# Patient Record
Sex: Female | Born: 1975 | Race: White | Hispanic: No | Marital: Married | Smoking: Never smoker
Health system: Southern US, Community
[De-identification: ages and names within clinical notes are randomized; demographics above are authoritative.]

## PROBLEM LIST (undated history)

## (undated) DIAGNOSIS — A159 Respiratory tuberculosis unspecified: Secondary | ICD-10-CM

## (undated) HISTORY — DX: Respiratory tuberculosis unspecified: A15.9

---

## 2016-08-27 DIAGNOSIS — Z3A01 Less than 8 weeks gestation of pregnancy: Secondary | ICD-10-CM | POA: Diagnosis not present

## 2016-08-27 DIAGNOSIS — O09291 Supervision of pregnancy with other poor reproductive or obstetric history, first trimester: Secondary | ICD-10-CM | POA: Diagnosis not present

## 2016-08-27 DIAGNOSIS — Z3201 Encounter for pregnancy test, result positive: Secondary | ICD-10-CM | POA: Diagnosis not present

## 2016-08-30 ENCOUNTER — Other Ambulatory Visit (HOSPITAL_COMMUNITY): Payer: Self-pay | Admitting: Obstetrics

## 2016-08-30 DIAGNOSIS — O3680X0 Pregnancy with inconclusive fetal viability, not applicable or unspecified: Secondary | ICD-10-CM

## 2016-08-30 DIAGNOSIS — Z8759 Personal history of other complications of pregnancy, childbirth and the puerperium: Secondary | ICD-10-CM

## 2016-09-11 ENCOUNTER — Other Ambulatory Visit (HOSPITAL_COMMUNITY): Payer: Self-pay

## 2016-09-11 ENCOUNTER — Encounter (HOSPITAL_COMMUNITY): Payer: Self-pay

## 2016-09-17 DIAGNOSIS — O09521 Supervision of elderly multigravida, first trimester: Secondary | ICD-10-CM | POA: Diagnosis not present

## 2016-09-17 DIAGNOSIS — Z1151 Encounter for screening for human papillomavirus (HPV): Secondary | ICD-10-CM | POA: Diagnosis not present

## 2016-09-17 DIAGNOSIS — Z124 Encounter for screening for malignant neoplasm of cervix: Secondary | ICD-10-CM | POA: Diagnosis not present

## 2016-09-17 DIAGNOSIS — Z3A10 10 weeks gestation of pregnancy: Secondary | ICD-10-CM | POA: Diagnosis not present

## 2016-09-17 DIAGNOSIS — Z113 Encounter for screening for infections with a predominantly sexual mode of transmission: Secondary | ICD-10-CM | POA: Diagnosis not present

## 2016-09-17 LAB — OB RESULTS CONSOLE HEPATITIS B SURFACE ANTIGEN: Hepatitis B Surface Ag: NEGATIVE

## 2016-09-17 LAB — OB RESULTS CONSOLE HIV ANTIBODY (ROUTINE TESTING): HIV: NONREACTIVE

## 2016-09-17 LAB — OB RESULTS CONSOLE GC/CHLAMYDIA
Chlamydia: NEGATIVE
Gonorrhea: NEGATIVE

## 2016-09-17 LAB — OB RESULTS CONSOLE ABO/RH: RH Type: POSITIVE

## 2016-09-17 LAB — HM PAP SMEAR: HM Pap smear: NEGATIVE

## 2016-09-17 LAB — HIV ANTIBODY (ROUTINE TESTING W REFLEX): HIV Screen 4th Generation wRfx: NEGATIVE

## 2016-09-17 LAB — OB RESULTS CONSOLE RUBELLA ANTIBODY, IGM: Rubella: IMMUNE

## 2016-09-17 LAB — OB RESULTS CONSOLE RPR: RPR: NONREACTIVE

## 2016-09-17 LAB — OB RESULTS CONSOLE ANTIBODY SCREEN: Antibody Screen: NEGATIVE

## 2016-10-15 DIAGNOSIS — O09522 Supervision of elderly multigravida, second trimester: Secondary | ICD-10-CM | POA: Diagnosis not present

## 2016-10-15 DIAGNOSIS — Z3A14 14 weeks gestation of pregnancy: Secondary | ICD-10-CM | POA: Diagnosis not present

## 2016-10-29 DIAGNOSIS — Z361 Encounter for antenatal screening for raised alphafetoprotein level: Secondary | ICD-10-CM | POA: Diagnosis not present

## 2016-11-23 DIAGNOSIS — Z3A19 19 weeks gestation of pregnancy: Secondary | ICD-10-CM | POA: Diagnosis not present

## 2016-11-23 DIAGNOSIS — O09522 Supervision of elderly multigravida, second trimester: Secondary | ICD-10-CM | POA: Diagnosis not present

## 2016-12-19 DIAGNOSIS — O09522 Supervision of elderly multigravida, second trimester: Secondary | ICD-10-CM | POA: Diagnosis not present

## 2016-12-19 DIAGNOSIS — Z3A23 23 weeks gestation of pregnancy: Secondary | ICD-10-CM | POA: Diagnosis not present

## 2017-01-18 DIAGNOSIS — O09522 Supervision of elderly multigravida, second trimester: Secondary | ICD-10-CM | POA: Diagnosis not present

## 2017-01-18 DIAGNOSIS — Z3A27 27 weeks gestation of pregnancy: Secondary | ICD-10-CM | POA: Diagnosis not present

## 2017-01-18 DIAGNOSIS — Z3689 Encounter for other specified antenatal screening: Secondary | ICD-10-CM | POA: Diagnosis not present

## 2017-01-30 DIAGNOSIS — Z23 Encounter for immunization: Secondary | ICD-10-CM | POA: Diagnosis not present

## 2017-01-30 DIAGNOSIS — Z3689 Encounter for other specified antenatal screening: Secondary | ICD-10-CM | POA: Diagnosis not present

## 2017-01-30 DIAGNOSIS — O09523 Supervision of elderly multigravida, third trimester: Secondary | ICD-10-CM | POA: Diagnosis not present

## 2017-01-30 DIAGNOSIS — Z3A29 29 weeks gestation of pregnancy: Secondary | ICD-10-CM | POA: Diagnosis not present

## 2017-02-13 DIAGNOSIS — Z3A31 31 weeks gestation of pregnancy: Secondary | ICD-10-CM | POA: Diagnosis not present

## 2017-02-13 DIAGNOSIS — O09523 Supervision of elderly multigravida, third trimester: Secondary | ICD-10-CM | POA: Diagnosis not present

## 2017-02-27 DIAGNOSIS — O09523 Supervision of elderly multigravida, third trimester: Secondary | ICD-10-CM | POA: Diagnosis not present

## 2017-02-27 DIAGNOSIS — Z23 Encounter for immunization: Secondary | ICD-10-CM | POA: Diagnosis not present

## 2017-02-27 DIAGNOSIS — Z3A33 33 weeks gestation of pregnancy: Secondary | ICD-10-CM | POA: Diagnosis not present

## 2017-03-06 ENCOUNTER — Inpatient Hospital Stay (HOSPITAL_COMMUNITY): Payer: BLUE CROSS/BLUE SHIELD

## 2017-03-06 ENCOUNTER — Encounter (HOSPITAL_COMMUNITY): Payer: Self-pay | Admitting: *Deleted

## 2017-03-06 ENCOUNTER — Inpatient Hospital Stay (HOSPITAL_COMMUNITY)
Admit: 2017-03-06 | Discharge: 2017-03-06 | Disposition: A | Discharge status: Home / Self Care | Payer: BLUE CROSS/BLUE SHIELD | Attending: Obstetrics | Admitting: Obstetrics

## 2017-03-06 ENCOUNTER — Inpatient Hospital Stay (HOSPITAL_COMMUNITY)
Admission: AD | Admit: 2017-03-06 | Discharge: 2017-03-09 | DRG: 783 | Disposition: A | Discharge status: Home / Self Care | Payer: BLUE CROSS/BLUE SHIELD | Source: Ambulatory Visit | Attending: Obstetrics and Gynecology | Admitting: Obstetrics and Gynecology

## 2017-03-06 DIAGNOSIS — Z9851 Tubal ligation status: Secondary | ICD-10-CM

## 2017-03-06 DIAGNOSIS — Z3A34 34 weeks gestation of pregnancy: Secondary | ICD-10-CM | POA: Diagnosis not present

## 2017-03-06 DIAGNOSIS — O09293 Supervision of pregnancy with other poor reproductive or obstetric history, third trimester: Secondary | ICD-10-CM | POA: Diagnosis not present

## 2017-03-06 DIAGNOSIS — O365930 Maternal care for other known or suspected poor fetal growth, third trimester, not applicable or unspecified: Principal | ICD-10-CM | POA: Diagnosis present

## 2017-03-06 DIAGNOSIS — Z302 Encounter for sterilization: Secondary | ICD-10-CM

## 2017-03-06 DIAGNOSIS — O365990 Maternal care for other known or suspected poor fetal growth, unspecified trimester, not applicable or unspecified: Secondary | ICD-10-CM | POA: Diagnosis present

## 2017-03-06 DIAGNOSIS — O368993 Maternal care for other specified fetal problems, unspecified trimester, fetus 3: Secondary | ICD-10-CM | POA: Diagnosis not present

## 2017-03-06 DIAGNOSIS — Z0374 Encounter for suspected problem with fetal growth ruled out: Secondary | ICD-10-CM | POA: Diagnosis not present

## 2017-03-06 DIAGNOSIS — O365993 Maternal care for other known or suspected poor fetal growth, unspecified trimester, fetus 3: Secondary | ICD-10-CM | POA: Diagnosis not present

## 2017-03-06 DIAGNOSIS — Z3689 Encounter for other specified antenatal screening: Secondary | ICD-10-CM | POA: Diagnosis not present

## 2017-03-06 DIAGNOSIS — O09523 Supervision of elderly multigravida, third trimester: Secondary | ICD-10-CM | POA: Diagnosis not present

## 2017-03-06 LAB — COMPREHENSIVE METABOLIC PANEL
ALT: 13 U/L — ABNORMAL LOW (ref 14–54)
AST: 23 U/L (ref 15–41)
Albumin: 3.2 g/dL — ABNORMAL LOW (ref 3.5–5.0)
Alkaline Phosphatase: 139 U/L — ABNORMAL HIGH (ref 38–126)
Anion gap: 9 (ref 5–15)
BUN: 10 mg/dL (ref 6–20)
CO2: 19 mmol/L — ABNORMAL LOW (ref 22–32)
Calcium: 8.7 mg/dL — ABNORMAL LOW (ref 8.9–10.3)
Chloride: 106 mmol/L (ref 101–111)
Creatinine, Ser: 0.67 mg/dL (ref 0.44–1.00)
GFR calc Af Amer: >60 mL/min (ref >60)
GFR calc non Af Amer: >60 mL/min (ref >60)
Glucose, Bld: 74 mg/dL (ref 65–99)
Potassium: 4.1 mmol/L (ref 3.5–5.1)
Sodium: 134 mmol/L — ABNORMAL LOW (ref 135–145)
Total Bilirubin: 0.4 mg/dL (ref 0.3–1.2)
Total Protein: 6.3 g/dL — ABNORMAL LOW (ref 6.5–8.1)

## 2017-03-06 LAB — CBC
HCT: 41.5 % (ref 36.0–46.0)
Hemoglobin: 14.5 g/dL (ref 12.0–15.0)
MCH: 31.5 pg (ref 26.0–34.0)
MCHC: 34.9 g/dL (ref 30.0–36.0)
MCV: 90.2 fL (ref 78.0–100.0)
Platelets: 205 10*3/uL (ref 150–400)
RBC: 4.6 MIL/uL (ref 3.87–5.11)
RDW: 13.3 % (ref 11.5–15.5)
WBC: 11 10*3/uL — ABNORMAL HIGH (ref 4.0–10.5)

## 2017-03-06 LAB — ABO/RH: ABO/RH(D): O POS

## 2017-03-06 LAB — TYPE AND SCREEN
ABO/RH(D): O POS
Antibody Screen: NEGATIVE

## 2017-03-06 LAB — URIC ACID: Uric Acid, Serum: 4.9 mg/dL (ref 2.3–6.6)

## 2017-03-06 MED ORDER — SOD CITRATE-CITRIC ACID 500-334 MG/5ML PO SOLN
ORAL | Status: AC
  Administered 2017-03-07: 30 mL via ORAL
  Filled 2017-03-06: qty 15

## 2017-03-06 MED ORDER — BETAMETHASONE SOD PHOS & ACET 6 (3-3) MG/ML IJ SUSP
12.0000 mg | Freq: Once | INTRAMUSCULAR | Status: AC
  Administered 2017-03-07: 12 mg via INTRAMUSCULAR
  Filled 2017-03-06: qty 2

## 2017-03-06 MED ORDER — PRENATAL MULTIVITAMIN CH: 1.0000 | ORAL_TABLET | Freq: Every day | ORAL | Status: DC

## 2017-03-06 MED ORDER — CALCIUM CARBONATE ANTACID 500 MG PO CHEW: 2.0000 | CHEWABLE_TABLET | ORAL | Status: DC | PRN

## 2017-03-06 MED ORDER — LACTATED RINGERS IV SOLN
INTRAVENOUS | Status: DC
  Administered 2017-03-06: 17:00:00 via INTRAVENOUS
  Administered 2017-03-06: 125 mL via INTRAVENOUS
  Administered 2017-03-06: 300 mL via INTRAVENOUS
  Administered 2017-03-06 – 2017-03-07 (×5): via INTRAVENOUS

## 2017-03-06 MED ORDER — ACETAMINOPHEN 325 MG PO TABS
650.0000 mg | ORAL_TABLET | ORAL | Status: DC | PRN
  Administered 2017-03-06: 650 mg via ORAL
  Filled 2017-03-06: qty 2

## 2017-03-06 MED ORDER — ZOLPIDEM TARTRATE 5 MG PO TABS: 5.0000 mg | ORAL_TABLET | Freq: Every evening | ORAL | Status: DC | PRN

## 2017-03-06 MED ORDER — DOCUSATE SODIUM 100 MG PO CAPS
100.0000 mg | ORAL_CAPSULE | Freq: Every day | ORAL | Status: DC
  Administered 2017-03-08 – 2017-03-09 (×2): 100 mg via ORAL
  Filled 2017-03-06 (×2): qty 1

## 2017-03-06 NOTE — Progress Notes (Signed)
BPP 6/8 this pm on repeat UAD with intermittent AEDF AFI nl per MFM. BP 131/69   Pulse (!) 102   Temp 98.2 F (36.8 C) (Oral)   Resp 16   Ht  (1.6 m)   Wt 77.1 kg (170 lb)   Breastfeeding? Unknown   BMI 30.11 kg/m   CBC    Component Value Date/Time   WBC 11.0 (H) 03/06/2017 1045   RBC 4.60 03/06/2017 1045   HGB 14.5 03/06/2017 1045   HCT 41.5 03/06/2017 1045   PLT 205 03/06/2017 1045   MCV 90.2 03/06/2017 1045   MCH 31.5 03/06/2017 1045   MCHC 34.9 03/06/2017 1045   RDW 13.3 03/06/2017 1045    CMP     Component Value Date/Time   NA 134 (L) 03/06/2017 1045   K 4.1 03/06/2017 1045   CL 106 03/06/2017 1045   CO2 19 (L) 03/06/2017 1045   GLUCOSE 74 03/06/2017 1045   BUN 10 03/06/2017 1045   CREATININE 0.67 03/06/2017 1045   CALCIUM 8.7 (L) 03/06/2017 1045   PROT 6.3 (L) 03/06/2017 1045   ALBUMIN 3.2 (L) 03/06/2017 1045   AST 23 03/06/2017 1045   ALT 13 (L) 03/06/2017 1045   ALKPHOS 139 (H) 03/06/2017 1045   BILITOT 0.4 03/06/2017 1045   GFRNONAA >60 03/06/2017 1045   GFRAA >60 03/06/2017 1045    FHR 140-150 , occ deep variable , occ prolonged decel, Baseline the BTBV 5-25, 15x15 accels noted Pt strongly desires VD and decline csection at this time. Will observe tracing (category 2) at this time.  Will consider MFM  And NICU recommendations to complete BMZ course if fetus tolerant. Will consider rpt BPP pending FHR tracing in am. If IOL considered, Will do CST prior.

## 2017-03-06 NOTE — H&P (Addendum)
Mary Ingram is a 41 y.o. female she is G10, P6. Sent from office by Dr Ernestina Penna for admission for IUGR, abnormal Dopplers (AEDF) and BPP 4/8.  Pt had growth sono 3 wks back and there is no interval growth today.   Healthy but AMA, declined Aneuploidy screen. AFP1 normal. Anatomy sono normal.  She reports normal fetal movements. No contractions/ bleeding/ vaginal fluid.  Prior 6 SVDs, last child 1 yo, didn't take epidural for most of her deliveries.   Office sono- EFW 3'1" with 0% interval growth since last sono 3 wks back on 9/19 (EFW 2'14" then). AFI 6.7 cm. VTX. BPP 4/8. S/D elevated with absent EDF.    OB History    Gravida Para Term Preterm AB Living   SAB TAB Ectopic Multiple Live Births   3       6     Past Medical History:  Diagnosis Date  . Tuberculosis    Positive skin test in college, took medication   History reviewed. No pertinent surgical history. Family History: family history includes Alzheimer's disease in her paternal grandmother; Diabetes in her maternal grandfather, maternal grandmother, and mother; Heart disease in her maternal grandfather; Hyperlipidemia in her maternal grandfather, maternal grandmother, and mother; Hypertension in her maternal grandfather, maternal grandmother, and mother; Mental illness in her maternal grandfather and maternal grandmother. Social History:  reports that she has never smoked. She has never used smokeless tobacco. She reports that she does not drink alcohol or use drugs.     Maternal Diabetes: No Genetic Screening: Declined Maternal Ultrasounds/Referrals: Normal anatomy sono.  Fetal Ultrasounds or other Referrals:  MFM referral today for BPP/ Dopplers and delivery planning Maternal Substance Abuse:  No Significant Maternal Medications:  None Significant Maternal Lab Results: GBS sent now  Other Comments:  None  ROS  neg History   Exam Physical Exam  BP 139/70   Pulse 93   Temp 98.2 F (36.8 C) (Oral)    Resp 20   Ht  (1.6 m)   Wt 170 lb (77.1 kg)   Breastfeeding? Unknown   BMI 30.11 kg/m   A&O x 3, no acute distress. Pleasant but tearful, feels she is being blamed for not eating well.  HEENT neg, no thyromegaly Lungs CTA bilat CV RRR, S1S2 normal Abdo soft, non tender, non acute Extr no edema/ tenderness Pelvic deferred FHT  145/ + accels/ no decels/ minimum to mod variability/ overall reactive Toco None   Prenatal labs: ABO, Rh: O/Positive/-- (04/23 0000) Antibody: Negative (04/23 0000) Rubella: Immune RPR: Nonreactive (04/23 0000)  HBsAg: Negative (04/23 0000)  HIV: Non-reactive (04/23 0000)  GBS:   sent today Aneuploidy screen declined AFP1 normal   Assessment/Plan: 41 yo, AMA, G10,P6- here at 34.3 wks with severe IUGR, abnormal Dopplers with absent end diastolic flow and BPP 4/8. FHT is reactive (6/10). BTMZ #1 at 10 am today (in office) MFM consult, repeat BPP, Dopplers, delivery planning.  Vtx, plan IOL if baby tolerates labor. If fetal intolerance to labor and C/section needed, she agrees and desires Tubal Ligation for permanent sterilization.     Mary Ingram 03/06/2017, 11:45 AM

## 2017-03-06 NOTE — Consult Note (Signed)
MFM Note  Ms. Mary Ingram is a 41 year old G46P6A3 Caucasian female at 34+3 weeks who was admitted this AM for severe fetal growth restriction. Her prenatal care had been fairly unremarkable until ~ 3 weeks ago when an ultrasound showed the EFW to be at the 15th %tile, AC at the 1st %tile, normal AFV and normal UA dopplers. The follow-up ultrasound today showed essentially no interval growth, abnormal dopplers and a BPP of 4/8. She received her first BMZ injection in the office before admission.  OB History: 6 full term vaginal deliveries; last one at 38 weeks for FGR; SABs x 3  A repeat US in MFM showed an overall growth lag of ~ 5 weeks with the EFW < 10th %tile (2+15 or 1335 grams),  AC < 3rd %tile; low normal AFV, cephalic presentation, no gross abnormalities, BPP 6/8 (-2 for no BM) and UA dopplers with elevated S/D ratios with a few tracings showing absent end diastolic flow.  Ms. Mary Ingram reports excellent fetal movement and has no other complaints.  Assessment: 1) SIUP at 34+3 weeks 2) Severe fetal growth restriction, most likely secondary to placental insufficiency 3) AMA; declined aneuploidy testing 4) H/O FGR in last pregnancy  Based on gestational age, severity of growth restriction and abnormal UA dopplers (AEDF), I recommend delivery. Delivery may be delayed for a course of BMZ to be given if continuous monitoring remains reassuring. Method of delivery deferred to attending OB but trial of induction not unreasonable if tracing remains reactive.  Thank you for the kind referral.  (Face-to-face consultation with patient: 45 min)

## 2017-03-06 NOTE — Progress Notes (Signed)
Patient ID: Mary Ingram, female   DOB: 06/09/1975, 41 y.o.   MRN: [id number] Late entry-  At 12 noon FHT started to decelerate, went down to 60s, at 5-6 minutes, we counseled pt on emergency C-section, Anesthesia team in room and OR informed. Pt agreed. But at 8 minutes it was back to baseline 145- and with normal moderate variability and no further decels noted.  So C/section was placed on hold. Pt's husband contacted who was en route.  I spoke with Dr Sherrie George as repeating BPP/ Dopplers would not be indicated at this point and delivery should be planned. Dr Sherrie George agreed, considering 34 wks, no interval growth in 3 weeks, S/D with AEDF and now a decel, agreed to proceed with delivery.  Considering FHT was back to normal and prior 6 SVDs, option to attempt vaginal delivery with careful induction v/s C/section was reviewed with patient. She would like vaginal trial as long as baby allowed and will accept C-section if needed.  Her husband arrived at 12.40 pm and I reviewed all the findings from this morning sono, current FHT and 8 minute decel.  We reviewed tubal ligation as permanent method of sterilization and risk of regret and irreversibility, reviewed salpingectomy data.   After discussion, plan is to consider OCT. Will transfer care to now on call MD Dr Billy Coast, and will have plan once he arrives.   V.Deneene Tarver, MD

## 2017-03-06 NOTE — Consult Note (Signed)
Neonatology Consult to Antenatal Patient:  I was asked by Dr. Billy Coast to see this patient in order to provide antenatal counseling due to IUGR, absent EDF, and BPP 4/8.  Mary Ingram was admitted today at 76 3/[redacted] weeks GA after being seen in the office and noted to have no fetal growth in the past 3 weeks. She is currently not having active labor. She is getting BMZ. The baby is a female. Delivery is anticipated as soon as the steroid course is complete or sooner if there is fetal distress.  I spoke with the patient and her husband. We discussed what they could expect at delivery in the next 2-3 days, including usual DR management, possible respiratory complications and need for support (not likely to require much support, as SGA infants often have accelerated lung maturity), IV access, feedings (mother desires formula feeding, and is not interested in donor breast milk), LOS, Mortality and Morbidity, and long term outcomes. They did not have any questions at this time. I offered a NICU tour to any interested family members and would be glad to come back if they have more questions later.  Thank you for asking me to see this patient.  Mary Sou, MD Neonatologist  The total length of face-to-face or floor/unit time for this encounter was 20 minutes. Counseling and/or coordination of care was 15 minutes of the above.

## 2017-03-06 NOTE — Progress Notes (Signed)
Patient ID: Mary Ingram, female   DOB: 1976/02/12, 41 y.o.   MRN: [id number] Previous notes reviewed. Reports good FM BP (!) 132/52   Pulse 99   Temp 98.2 F (36.8 C) (Oral)   Resp 18   Ht  (1.6 m)   Wt 77.1 kg (170 lb)   Breastfeeding? Unknown   BMI 30.11 kg/m   FHR 140-150, BTBV 5-25, random late decels noted occ mild variable noted. Overall reassuring FHR tracing. Case discussed with Dr. Decker(MFM) and Dr DaVonzo(NICU).  Benefits of BMZ in this setting (EGA, IUGR, abnormal UAD) discussed. Will repeat BPP and UAD at this time. If reassuring and FHR continues to be reassuring , will complete BMZ course and then proceed with CST. If BPP not reassuring, will do CST. If persistent decels or category 3 tracing, will proceed with csection. Pt and husband present for discussion. They concur with plan and will proceed.

## 2017-03-06 NOTE — Progress Notes (Signed)
Patient ID: Mary Ingram, female   DOB: 10-Jun-1975, 41 y.o.   MRN: [id number]  Pt comfortable Good FM BP (!) 118/56   Pulse (!) 106   Temp 98 F (36.7 C) (Oral)   Resp 14   Ht  (1.6 m)   Wt 77.1 kg (170 lb)   Breastfeeding? Unknown   BMI 30.11 kg/m  FHR tracing stable with intermittent 1-2 min decels and long stretches of reassuring FHR. Baseline 140-150, BTBV 5-25, 15x15 accels. Category 2 tracing in setting of prematurity. BMZ incomplete Will continue to monitor closely. Plan discussed with MFM

## 2017-03-07 ENCOUNTER — Encounter (HOSPITAL_COMMUNITY): Payer: Self-pay | Admitting: Certified Registered Nurse Anesthetist

## 2017-03-07 ENCOUNTER — Inpatient Hospital Stay (HOSPITAL_COMMUNITY): Payer: BLUE CROSS/BLUE SHIELD | Admitting: Anesthesiology

## 2017-03-07 ENCOUNTER — Encounter (HOSPITAL_COMMUNITY)
Admission: AD | Disposition: A | Discharge status: Home / Self Care | Payer: Self-pay | Source: Ambulatory Visit | Attending: Obstetrics and Gynecology

## 2017-03-07 DIAGNOSIS — Z9851 Tubal ligation status: Secondary | ICD-10-CM

## 2017-03-07 LAB — RPR: RPR Ser Ql: NONREACTIVE

## 2017-03-07 SURGERY — Surgical Case
Anesthesia: Spinal

## 2017-03-07 MED ORDER — ONDANSETRON HCL 4 MG/2ML IJ SOLN
INTRAMUSCULAR | Status: DC | PRN
  Administered 2017-03-07: 4 mg via INTRAVENOUS

## 2017-03-07 MED ORDER — BUPIVACAINE IN DEXTROSE 0.75-8.25 % IT SOLN
INTRATHECAL | Status: DC | PRN
  Administered 2017-03-07: 1.3 mL via INTRATHECAL

## 2017-03-07 MED ORDER — TERBUTALINE SULFATE 1 MG/ML IJ SOLN: 0.2500 mg | Freq: Once | INTRAMUSCULAR | Status: DC | PRN

## 2017-03-07 MED ORDER — IBUPROFEN 600 MG PO TABS: 600.0000 mg | ORAL_TABLET | Freq: Four times a day (QID) | ORAL | Status: DC

## 2017-03-07 MED ORDER — OXYTOCIN 10 UNIT/ML IJ SOLN
INTRAMUSCULAR | Status: AC
  Filled 2017-03-07: qty 4

## 2017-03-07 MED ORDER — OXYTOCIN 40 UNITS IN LACTATED RINGERS INFUSION - SIMPLE MED: 2.5000 [IU]/h | INTRAVENOUS | Status: AC

## 2017-03-07 MED ORDER — PRENATAL MULTIVITAMIN CH
1.0000 | ORAL_TABLET | Freq: Every day | ORAL | Status: DC
  Administered 2017-03-08: 1 via ORAL
  Filled 2017-03-07: qty 1

## 2017-03-07 MED ORDER — DIPHENHYDRAMINE HCL 25 MG PO CAPS: 25.0000 mg | ORAL_CAPSULE | Freq: Four times a day (QID) | ORAL | Status: DC | PRN

## 2017-03-07 MED ORDER — IBUPROFEN 600 MG PO TABS
600.0000 mg | ORAL_TABLET | Freq: Four times a day (QID) | ORAL | Status: DC
  Administered 2017-03-08 – 2017-03-09 (×4): 600 mg via ORAL
  Filled 2017-03-07 (×4): qty 1

## 2017-03-07 MED ORDER — MEPERIDINE HCL 25 MG/ML IJ SOLN
INTRAMUSCULAR | Status: DC | PRN
  Administered 2017-03-07 (×2): 12.5 mg via INTRAVENOUS

## 2017-03-07 MED ORDER — OXYTOCIN 40 UNITS IN LACTATED RINGERS INFUSION - SIMPLE MED
INTRAVENOUS | Status: AC
  Filled 2017-03-07: qty 1000

## 2017-03-07 MED ORDER — FENTANYL CITRATE (PF) 100 MCG/2ML IJ SOLN
INTRAMUSCULAR | Status: DC | PRN
  Administered 2017-03-07: 25 ug via INTRATHECAL

## 2017-03-07 MED ORDER — SENNOSIDES-DOCUSATE SODIUM 8.6-50 MG PO TABS
2.0000 | ORAL_TABLET | ORAL | Status: DC
  Administered 2017-03-07 – 2017-03-08 (×2): 2 via ORAL
  Filled 2017-03-07 (×2): qty 2

## 2017-03-07 MED ORDER — WITCH HAZEL-GLYCERIN EX PADS: 1.0000 "application " | MEDICATED_PAD | CUTANEOUS | Status: DC | PRN

## 2017-03-07 MED ORDER — SIMETHICONE 80 MG PO CHEW: 80.0000 mg | CHEWABLE_TABLET | ORAL | Status: DC | PRN

## 2017-03-07 MED ORDER — KETOROLAC TROMETHAMINE 30 MG/ML IJ SOLN: 30.0000 mg | Freq: Four times a day (QID) | INTRAMUSCULAR | Status: AC | PRN

## 2017-03-07 MED ORDER — LACTATED RINGERS IV SOLN
INTRAVENOUS | Status: DC | PRN
  Administered 2017-03-07: 14:00:00 via INTRAVENOUS

## 2017-03-07 MED ORDER — COCONUT OIL OIL: 1.0000 "application " | TOPICAL_OIL | Status: DC | PRN

## 2017-03-07 MED ORDER — MORPHINE SULFATE (PF) 0.5 MG/ML IJ SOLN
INTRAMUSCULAR | Status: DC | PRN
Start: 2017-03-07 — End: 2017-03-07
  Administered 2017-03-07: .1 mg via INTRATHECAL

## 2017-03-07 MED ORDER — ACETAMINOPHEN 325 MG PO TABS: 650.0000 mg | ORAL_TABLET | ORAL | Status: DC | PRN

## 2017-03-07 MED ORDER — MENTHOL 3 MG MT LOZG: 1.0000 | LOZENGE | OROMUCOSAL | Status: DC | PRN

## 2017-03-07 MED ORDER — MORPHINE SULFATE (PF) 0.5 MG/ML IJ SOLN
INTRAMUSCULAR | Status: AC
  Filled 2017-03-07: qty 10

## 2017-03-07 MED ORDER — ONDANSETRON HCL 4 MG/2ML IJ SOLN
INTRAMUSCULAR | Status: AC
  Filled 2017-03-07: qty 2

## 2017-03-07 MED ORDER — OXYTOCIN 40 UNITS IN LACTATED RINGERS INFUSION - SIMPLE MED
1.0000 m[IU]/min | INTRAVENOUS | Status: DC
  Administered 2017-03-07: 1 m[IU]/min via INTRAVENOUS
  Administered 2017-03-07: 3 m[IU]/min via INTRAVENOUS
  Administered 2017-03-07: 2 m[IU]/min via INTRAVENOUS

## 2017-03-07 MED ORDER — OXYCODONE-ACETAMINOPHEN 5-325 MG PO TABS: 1.0000 | ORAL_TABLET | ORAL | Status: DC | PRN

## 2017-03-07 MED ORDER — BUPIVACAINE IN DEXTROSE 0.75-8.25 % IT SOLN
INTRATHECAL | Status: AC
  Filled 2017-03-07: qty 2

## 2017-03-07 MED ORDER — SIMETHICONE 80 MG PO CHEW
80.0000 mg | CHEWABLE_TABLET | Freq: Three times a day (TID) | ORAL | Status: DC
  Administered 2017-03-08 – 2017-03-09 (×3): 80 mg via ORAL
  Filled 2017-03-07 (×3): qty 1

## 2017-03-07 MED ORDER — LACTATED RINGERS IV SOLN
INTRAVENOUS | Status: DC | PRN
  Administered 2017-03-07: 40 [IU] via INTRAVENOUS

## 2017-03-07 MED ORDER — OXYCODONE-ACETAMINOPHEN 5-325 MG PO TABS: 2.0000 | ORAL_TABLET | ORAL | Status: DC | PRN

## 2017-03-07 MED ORDER — DIBUCAINE 1 % RE OINT: 1.0000 "application " | TOPICAL_OINTMENT | RECTAL | Status: DC | PRN

## 2017-03-07 MED ORDER — PHENYLEPHRINE 8 MG IN D5W 100 ML (0.08MG/ML) PREMIX OPTIME
INJECTION | INTRAVENOUS | Status: DC | PRN
  Administered 2017-03-07: 60 ug/min via INTRAVENOUS

## 2017-03-07 MED ORDER — CEFAZOLIN SODIUM-DEXTROSE 2-4 GM/100ML-% IV SOLN
INTRAVENOUS | Status: AC
  Filled 2017-03-07: qty 100

## 2017-03-07 MED ORDER — MEPERIDINE HCL 25 MG/ML IJ SOLN
INTRAMUSCULAR | Status: AC
  Filled 2017-03-07: qty 1

## 2017-03-07 MED ORDER — SOD CITRATE-CITRIC ACID 500-334 MG/5ML PO SOLN
30.0000 mL | Freq: Once | ORAL | Status: AC
  Administered 2017-03-07: 30 mL via ORAL

## 2017-03-07 MED ORDER — FENTANYL CITRATE (PF) 100 MCG/2ML IJ SOLN
INTRAMUSCULAR | Status: AC
  Filled 2017-03-07: qty 2

## 2017-03-07 MED ORDER — SIMETHICONE 80 MG PO CHEW
80.0000 mg | CHEWABLE_TABLET | ORAL | Status: DC
  Administered 2017-03-07 – 2017-03-08 (×2): 80 mg via ORAL
  Filled 2017-03-07 (×2): qty 1

## 2017-03-07 MED ORDER — CEFAZOLIN SODIUM-DEXTROSE 2-3 GM-% IV SOLR
INTRAVENOUS | Status: DC | PRN
  Administered 2017-03-07: 2 g via INTRAVENOUS

## 2017-03-07 MED ORDER — LACTATED RINGERS IV SOLN: INTRAVENOUS | Status: DC

## 2017-03-07 SURGICAL SUPPLY — 34 items
BENZOIN TINCTURE PRP APPL 2/3 (GAUZE/BANDAGES/DRESSINGS) ×2 IMPLANT
CHLORAPREP W/TINT 26ML (MISCELLANEOUS) ×2 IMPLANT
CLAMP CORD UMBIL (MISCELLANEOUS) IMPLANT
CLOSURE STERI STRIP 1/2 X4 (GAUZE/BANDAGES/DRESSINGS) ×2 IMPLANT
CLOTH BEACON ORANGE TIMEOUT ST (SAFETY) ×2 IMPLANT
CONTAINER PREFILL 10% NBF 15ML (MISCELLANEOUS) IMPLANT
DRSG OPSITE POSTOP 4X10 (GAUZE/BANDAGES/DRESSINGS) ×2 IMPLANT
ELECT REM PT RETURN 9FT ADLT (ELECTROSURGICAL) ×2
ELECTRODE REM PT RTRN 9FT ADLT (ELECTROSURGICAL) ×1 IMPLANT
EXTRACTOR VACUUM M CUP 4 TUBE (SUCTIONS) IMPLANT
GLOVE BIO SURGEON STRL SZ 6.5 (GLOVE) ×2 IMPLANT
GLOVE BIOGEL PI IND STRL 7.0 (GLOVE) ×2 IMPLANT
GLOVE BIOGEL PI INDICATOR 7.0 (GLOVE) ×2
GOWN STRL REUS W/TWL LRG LVL3 (GOWN DISPOSABLE) ×4 IMPLANT
HEMOSTAT SURGICEL 2X14 (HEMOSTASIS) ×2 IMPLANT
KIT ABG SYR 3ML LUER SLIP (SYRINGE) IMPLANT
NEEDLE HYPO 22GX1.5 SAFETY (NEEDLE) IMPLANT
NEEDLE HYPO 25X5/8 SAFETYGLIDE (NEEDLE) IMPLANT
NS IRRIG 1000ML POUR BTL (IV SOLUTION) ×2 IMPLANT
PACK C SECTION WH (CUSTOM PROCEDURE TRAY) ×2 IMPLANT
PAD OB MATERNITY 4.3X12.25 (PERSONAL CARE ITEMS) ×2 IMPLANT
PENCIL SMOKE EVAC W/HOLSTER (ELECTROSURGICAL) ×2 IMPLANT
STRIP CLOSURE SKIN 1/2X4 (GAUZE/BANDAGES/DRESSINGS) IMPLANT
SUT MON AB 4-0 PS1 27 (SUTURE) ×2 IMPLANT
SUT PLAIN 0 NONE (SUTURE) ×2 IMPLANT
SUT PLAIN 2 0 XLH (SUTURE) IMPLANT
SUT VIC AB 0 CT1 36 (SUTURE) ×4 IMPLANT
SUT VIC AB 0 CTX 36 (SUTURE) ×2
SUT VIC AB 0 CTX36XBRD ANBCTRL (SUTURE) ×2 IMPLANT
SUT VIC AB 2-0 CT1 27 (SUTURE) ×1
SUT VIC AB 2-0 CT1 TAPERPNT 27 (SUTURE) ×1 IMPLANT
SYR CONTROL 10ML LL (SYRINGE) IMPLANT
TOWEL OR 17X24 6PK STRL BLUE (TOWEL DISPOSABLE) ×2 IMPLANT
TRAY FOLEY BAG SILVER LF 14FR (SET/KITS/TRAYS/PACK) IMPLANT

## 2017-03-07 NOTE — Anesthesia Procedure Notes (Signed)
Spinal  Patient location during procedure: OR Staffing Anesthesiologist: Cristela Blue Spinal Block Patient position: sitting Prep: DuraPrep Patient monitoring: heart rate, blood pressure and continuous pulse ox Approach: right paramedian Location: L3-4 Injection technique: single-shot Needle Needle type: Sprotte  Needle gauge: 24 G Needle length: 9 cm Assessment Sensory level: T4 Additional Notes Spinal Dosage in OR  .75% Bupivicaine ml       1.3     [id number]   mcg        100    Fentanyl mcg            25

## 2017-03-07 NOTE — Op Note (Signed)
Cesarean Section Procedure Note   Mary Ingram  03/06/2017 - 03/07/2017  Indications: IUP at 34.4 wga, IUGR with abnormal dopplars, failed CST; unfavorable cervix and remote from delivery; desires permanent sterilization  Pre-operative Diagnosis: primary cesarean section and bilateral tubal ligation.   Post-operative Diagnosis: Same   Surgeon: Surgeon(s) and Role:    Noland Fordyce, MD -fist assist    * Jakyra Kenealy, Candace Gallus, MD   Assistants: Carlean Jews, second assist  Anesthesia: spinal   Procedure Details:  The patient was seen in the labor room and recommendation to proceed with delivery by c-section and pt agreed.  The risks, benefits, complications, treatment options, and expected outcomes were discussed with the patient. The patient concurred with the proposed plan, giving informed consent. identified as Mary Ingram and the procedure verified as C-Section Delivery with bilateral tubal ligation. A Time Out was held and the above information confirmed.  After induction of anesthesia, the patient was draped and prepped in the usual sterile manner. A transverse skin incision was made and carried down through the subcutaneous tissue to the fascia. Fascial incision was made with the scalpel and extended transversely. The fascia was separated from the underlying rectus tissue superiorly and inferiorly. The peritoneum was identified and entered. Peritoneal incision was extended longitudinally. The utero-vesical peritoneal reflection was incised transversely and the bladder flap was bluntly freed from the lower uterine segment. The lower uterine segment was evaluated and appeared properly developed for LUS incision.  A low transverse uterine incision was made. The infant was then delivered from cephalic presentation and body quick followed after delivery of head.  Nuchal cord x1 was noted and easily reduced. The umbilical cord was then  clamped and cut cord blood was obtained for  evaluation. The placenta was delivered spontaneously and intact. Very narrow umbilical cord noted.  The uterus was then exteriorized and cleared of clot and debirs.  The uterine outline, tubes and ovaries appeared normal}. The uterine incision was closed with running locked sutures of .  An embrocating layer was then performed using an 0 monocryl.    Figure of eight stitches placed at midline and excellent Hemostasis was observed. Clots cleared from pelvis with lap sponges.  Attention was then drawn to the right fallopian tube and was then grasped in the midportion of the tube with a babcock.  The bovie was then used to make a small opening in the mesosalpinx below the babcock.  The tube was then doubly clamped with plain gut.  The tube was blanched at both sited then the tube was transected with the metzembaum scissors.  Attention was then drawn to the left tube which was removed in a similar fashion.  The bovie was used to ensure excellent hemostasis from the pedicles and the sutures were then cut.  There was minimal bleeding then noted inferior to the uterine incision.  The bovie was carefully used to achieve hemostasis on the uterus about 2cm below the uterine incision.  The uterus was then placed back into the abdomen and the incision examined again- excellent hemostasis noted and just under bladder flap, surgicel placed to ensure hemostasis, bladder flap then placed over the surgicel.  Continued hemostasis was noted and the fascia was then reapproximated with running sutures of 0Vicryl. The subcutaneous layer was irrigated and hemostatic with the bovie.  The skin was then closed with 4-0 vicryl in a subcuticular fashion. Instrument, sponge, and needle counts were correct prior the abdominal closure and were correct at the conclusion  of the case. Pt was given 2g Ancef on call to or.   Findings: viable female infant, apgars not yet assigned at time of dictation; normal appear   Estimated Blood Loss:   Total IV Fluids:   Urine Output: 50cc clear urine  Specimens: placenta to pathology; tubal portions to pathology  Complications: no complications  Disposition: PACU - hemodynamically stable.   Maternal Condition: stable   Baby condition / location:  Couplet care / Skin to Skin  Attending Attestation: I was present and scrubbed for the entire procedure.   Signed: Surgeon(s): Noland Fordyce, MD Vick Frees, MD

## 2017-03-07 NOTE — Anesthesia Pain Management Evaluation Note (Signed)
  CRNA Pain Management Visit Note  Patient: Mary Ingram, 41 y.o., female  "Hello I am a member of the anesthesia team at [facility]. We have an anesthesia team available at all times to provide care throughout the hospital, including epidural management and anesthesia for C-section. I don't know your plan for the delivery whether it a natural birth, water birth, IV sedation, nitrous supplementation, doula or epidural, but we want to meet your pain goals."   1.Was your pain managed to your expectations on prior hospitalizations?   No prior hospitalizations  2.What is your expectation for pain management during this hospitalization?     Labor support without medications  3.How can we help you reach that goal? Standby  Record the patient's initial score and the patient's pain goal.   Pain: 0  Pain Goal: 10 [facility] wants you to be able to say your pain was always managed very well.  Mary Ingram 03/07/2017

## 2017-03-07 NOTE — Transfer of Care (Signed)
Immediate Anesthesia Transfer of Care Note  Patient: Mary Ingram  Procedure(s) Performed: CESAREAN SECTION (N/A )  Patient Location: PACU  Anesthesia Type:Spinal  Level of Consciousness: awake, alert , oriented and patient cooperative  Airway & Oxygen Therapy: Patient Spontanous Breathing  Post-op Assessment: Report given to RN and Post -op Vital signs reviewed and stable  Post vital signs: Reviewed and stable  Last Vitals:  Vitals:   03/07/17 1240 03/07/17 1304  BP: 136/74 129/82  Pulse: 65 84  Resp:    Temp:    SpO2:      Last Pain:  Vitals:   03/07/17 1235  TempSrc:   PainSc: 0-No pain      Patients Stated Pain Goal: 0 (03/06/17 2051)  Complications: No apparent anesthesia complications

## 2017-03-07 NOTE — Progress Notes (Addendum)
Pt comfortable in room; Dr Ernestina Penna had just spoken with patient about FHT   BP 129/82   Pulse 84   Temp 98.5 F (36.9 C) (Oral)   Resp 16   Ht  (1.6 m)   Wt 170 lb (77.1 kg)   SpO2 100%   Breastfeeding? Unknown   BMI 30.11 kg/m    A&ox3 nml respirations Abd: soft, nt, gravid LE: tr edema, nt bilat Cx: 1/th/-3 (see exam from this am)  Fht: baseline 140s, normal variability; then at 1130 spontaneous deceleration about 4-5 min with nadir to 60s, then return to baseline; subsequent spontaneous decelerations that were about 1-2 min and down to about 80s; normal variability through out and at times +accels after deceleration Toco: none  A/P: iup at 34.4wga with IUGR, abnormal dopplars; remote from delivery  1. Dr Ernestina Penna and I have reviewed the fht with pt and husband - no contractions with CST but pt with decelerations; fetus with known IUGR and abnormal dopplars; s/p second steroid dose this am at 1000 am; pt had previously declined c/s and was hoping for svd; CST begun - pitocin was stopped d/t continued spontaneous decelerations and reviewed recommendation to proceed with c/s now.  Patient and husband understand that cannot continue CST or proceed with IOL, and agree with plan to proceed for delivery via c/s.  Patient is aware of possible classical c/s.  The patient also wants btl.  Patient is 100% certain that she is done with fertility and wants bilateral tubal ligation.  Risks reviewed of surgeries: bleeding, infection, injury to bowel, bladder, nerves, blood vessels, risk of further surgery; risk of failure of btl and if pregnant, risk of ectopic.  Consent signed. 2.  Fetal status - failed CST, NRFHT remote from delivery; normal variability and +accels still noted; plan was for delivery after second dose of steroids, and cst started sicne pt wanted svd; now failed cst and proceed for c/s delivery 3.  Prematurity - s/p bmz x2, second dose given 1000 this am; s/p nicu consult 4.  GBS neg

## 2017-03-07 NOTE — Progress Notes (Signed)
Patient ID: Mary Ingram, female   DOB: 05/18/1976, 41 y.o.   MRN: [id number] Comfortable. Good FM BP (!) 116/47   Pulse 82   Temp 98.3 F (36.8 C)   Resp 16   Ht  (1.6 m)   Wt 77.1 kg (170 lb)   Breastfeeding? Unknown   BMI 30.11 kg/m  FHR tracing- 1-2 variables per hr, overall reassuring, BTBV 5-25, 15x15 accels Continue present care.

## 2017-03-07 NOTE — Anesthesia Preprocedure Evaluation (Addendum)
Anesthesia Evaluation  Patient identified by MRN, date of birth, ID band Patient awake    Reviewed: Allergy & Precautions, H&P , Patient's Chart, lab work & pertinent test results, reviewed documented beta blocker date and time   Airway Mallampati: II  TM Distance: >3 FB Neck ROM: full    Dental no notable dental hx.    Pulmonary    Pulmonary exam normal breath sounds clear to auscultation       Cardiovascular  Rhythm:regular Rate:Normal     Neuro/Psych    GI/Hepatic   Endo/Other    Renal/GU      Musculoskeletal   Abdominal   Peds  Hematology   Anesthesia Other Findings   Reproductive/Obstetrics                             Anesthesia Physical  Anesthesia Plan  ASA: II and emergent  Anesthesia Plan: Spinal   Post-op Pain Management:    Induction:   PONV Risk Score and Plan:   Airway Management Planned:   Additional Equipment:   Intra-op Plan:   Post-operative Plan:   Informed Consent: I have reviewed the patients History and Physical, chart, labs and discussed the procedure including the risks, benefits and alternatives for the proposed anesthesia with the patient or authorized representative who has indicated his/her understanding and acceptance.   Dental Advisory Given  Plan Discussed with: CRNA and Surgeon  Anesthesia Plan Comments: (  )        Anesthesia Quick Evaluation  

## 2017-03-07 NOTE — Progress Notes (Signed)
Patient ID: Mary Ingram, female   DOB: Aug 23, 1975, 41 y.o.   MRN: [id number] Good FM BP (!) 119/54   Pulse 79   Temp 97.8 F (36.6 C) (Oral)   Resp 16   Ht  (1.6 m)   Wt 77.1 kg (170 lb)   Breastfeeding? Unknown   BMI 30.11 kg/m   FHR tracing unchanged Occ decel. BTBV 5-25. 15x15accels Plan discussed with MFM Will give second dose BMZ then CST

## 2017-03-07 NOTE — Progress Notes (Signed)
Patient ID: Mary Ingram, female   DOB: [birth year], 41 y.o.   MRN: [id number] Pt comfortable Good FM  BP (!) 130/55   Pulse 91   Temp 97.9 F (36.6 C) (Oral)   Resp 16   Ht  (1.6 m)   Wt 77.1 kg (170 lb)   Breastfeeding? Unknown   BMI 30.11 kg/m   FHR 140 -150, BTBV 5-25, 15x15 accels 2 mild variable decels, 1 late decel in last 3 hours. Overall tracing improved.  IMP: IUGR with intermittent AEDF on UAD  P: Continuous EFM TOCO BMZ #2 this AM D/W MFM this am

## 2017-03-08 LAB — CULTURE, BETA STREP (GROUP B ONLY)

## 2017-03-08 LAB — CBC
HCT: 36.3 % (ref 36.0–46.0)
Hemoglobin: 12.4 g/dL (ref 12.0–15.0)
MCH: 31.2 pg (ref 26.0–34.0)
MCHC: 34.2 g/dL (ref 30.0–36.0)
MCV: 91.4 fL (ref 78.0–100.0)
Platelets: 201 10*3/uL (ref 150–400)
RBC: 3.97 MIL/uL (ref 3.87–5.11)
RDW: 13.5 % (ref 11.5–15.5)
WBC: 15.8 10*3/uL — ABNORMAL HIGH (ref 4.0–10.5)

## 2017-03-08 NOTE — Anesthesia Postprocedure Evaluation (Signed)
Anesthesia Post Note  Patient: Mary Ingram  Procedure(s) Performed: CESAREAN SECTION (N/A )     Patient location during evaluation: Mother Baby Anesthesia Type: Spinal Level of consciousness: oriented and awake and alert Pain management: pain level controlled Vital Signs Assessment: post-procedure vital signs reviewed and stable Respiratory status: spontaneous breathing, respiratory function stable and patient connected to nasal cannula oxygen Cardiovascular status: blood pressure returned to baseline and stable Postop Assessment: no headache, no backache, no apparent nausea or vomiting and patient able to bend at knees Anesthetic complications: no    Last Vitals:  Vitals:   03/08/17 0411 03/08/17 0500  BP: 108/62   Pulse: (!) 51   Resp: 16 17  Temp: (!) 36.4 C   SpO2: 98%     Last Pain:  Vitals:   03/08/17 0600  TempSrc:   PainSc: 0-No pain   Pain Goal: Patients Stated Pain Goal: 0 (03/07/17 1930)               Rica Records

## 2017-03-08 NOTE — Progress Notes (Signed)
Doing well, no c/o Pain controlled, tol po; ambulating; +flatus; normal lochia Will formula feed  Temp:  [97.5 F (36.4 C)-98.4 F (36.9 C)] 97.8 F (36.6 C) (10/12 0803) Pulse Rate:  [49-84] 73 (10/12 0803) Resp:  [7-22] 16 (10/12 0803) BP: (108-136)/(58-82) 128/79 (10/12 0803) SpO2:  [94 %-99 %] 98 % (10/12 0803)  A&ox3 rrr ctab Abd: +bs, soft, nt, nd; fundus firm and 2cm below umb Dressign: c/d/i  CBC    Component Value Date/Time   WBC 15.8 (H) 03/08/2017 0552   RBC 3.97 03/08/2017 0552   HGB 12.4 03/08/2017 0552   HCT 36.3 03/08/2017 0552   PLT 201 03/08/2017 0552   MCV 91.4 03/08/2017 0552   MCH 31.2 03/08/2017 0552   MCHC 34.2 03/08/2017 0552   RDW 13.5 03/08/2017 0552   A/P pod 1 s/p ltcs d/t iugr 1. Doing well, pt would like d/c tomorrow and plan d/c 1 day 2. Rh pos 3. Rubella immune 4. Infant doing well in NICU per pt

## 2017-03-08 NOTE — Lactation Note (Signed)
This note was copied from a baby's chart. Lactation Consultation Note  Patient Name: Boy Alyiah Ulloa [id number] Date: 03/08/2017   NICU baby 17 hours old. Mom not pumping, has chosen not to provide EBM for NICU baby per [facility], Charity fundraiser.   Maternal Data    Feeding    LATCH Score                   Interventions    Lactation Tools Discussed/Used     Consult Status      Sherlyn Hay 03/08/2017, 11:13 AM

## 2017-03-09 MED ORDER — OXYCODONE-ACETAMINOPHEN 5-325 MG PO TABS: 1.0000 | ORAL_TABLET | ORAL | 0 refills | Status: DC | PRN

## 2017-03-09 MED ORDER — IBUPROFEN 600 MG PO TABS: 600.0000 mg | ORAL_TABLET | Freq: Four times a day (QID) | ORAL | 0 refills | Status: DC

## 2017-03-09 MED ORDER — DOCUSATE SODIUM 100 MG PO CAPS: 100.0000 mg | ORAL_CAPSULE | Freq: Every day | ORAL | 0 refills | Status: DC

## 2017-03-09 MED ORDER — ACETAMINOPHEN 325 MG PO TABS: 650.0000 mg | ORAL_TABLET | ORAL | Status: DC | PRN

## 2017-03-09 NOTE — Progress Notes (Signed)
Discharge instructions, prescriptions and follow up appointments reviewed with patient. Patient tearful about missing her children and leaving baby in the NICU.

## 2017-03-09 NOTE — Discharge Summary (Signed)
OB Discharge Summary     Patient Name: Mary Ingram DOB: 11/05/1975 MRN: [id number]  Date of admission: 03/06/2017 Delivering MD: Rhoderick Moody E   Date of discharge: 03/09/2017  Admitting diagnosis: PTL Intrauterine pregnancy: [redacted]w[redacted]d     Secondary diagnosis:  Principal Problem:   Postpartum care following cesarean delivery (10/11) Active Problems:   IUGR (intrauterine growth restriction) affecting care of mother   Cesarean delivery delivered: indication: NRFHR, IUGR, abnormal dopplers   S/P bilateral tubal ligation      Discharge diagnosis: Preterm Pregnancy Delivered                                                                                                Post partum procedures: none  Hospital course:  Sceduled C/S   41 y.o. yo [id number] at [redacted]w[redacted]d was admitted to the hospital 03/06/2017 for IUGR with abnormal dopplars, failed CST; unfavorable cervix and remote from delivery; desired permanent sterilization. Membrane Rupture Time/Date: 2:05 PM ,03/07/2017  Patient delivered a Viable infant.03/07/2017 Details of operation can be found in separate operative note.  Patient had an uncomplicated postpartum course.  She is ambulating, tolerating a regular diet, passing flatus, and urinating well. Patient is discharged home in stable condition on  03/09/17, newborn remains in NICU for prematurity.         Physical exam  Vitals:   03/08/17 2010 03/09/17 0612 03/09/17 0757 03/09/17 1219  BP: 135/78  (!) 147/76 137/74  Pulse: 63  68   Resp: Temp: 98.2 F (36.8 C)  98 F (36.7 C)   TempSrc: Oral  Oral   SpO2: 98%  99%   Weight:      Height:       General: alert, cooperative and no distress Lochia: appropriate Uterine Fundus: firm Incision: Dressing is clean, dry, and intact DVT Evaluation: No cords or calf tenderness. No significant calf/ankle edema. Labs: Lab Results  Component Value Date   WBC 15.8 (H) 03/08/2017   HGB 12.4 03/08/2017   HCT 36.3  03/08/2017   MCV 91.4 03/08/2017   PLT 201 03/08/2017   CMP Latest Ref Rng & Units 03/06/2017  Glucose 65 - 99 mg/dL 74  BUN 6 - 20 mg/dL 10  Creatinine 4.78 - 2.95 mg/dL 6.21  Sodium 308 - 657 mmol/L 134(L)  Potassium 3.5 - 5.1 mmol/L 4.1  Chloride 101 - 111 mmol/L 106  CO2 22 - 32 mmol/L 19(L)  Calcium 8.9 - 10.3 mg/dL 8.4(O)  Total Protein 6.5 - 8.1 g/dL 6.3(L)  Total Bilirubin 0.3 - 1.2 mg/dL 0.4  Alkaline Phos 38 - 126 U/L 139(H)  AST 15 - 41 U/L 23  ALT 14 - 54 U/L 13(L)    Discharge instruction: per After Visit Summary and "Baby and Me Booklet".  After visit meds:  Allergies as of 03/09/2017   No Known Allergies     Medication List    TAKE these medications   acetaminophen 325 MG tablet Commonly known as:  TYLENOL Take 2 tablets (650 mg total) Ekaterini Capitanoy 4 (four) hours as needed (for pain  scale < 4).   docusate sodium 100 MG capsule Commonly known as:  COLACE Take 1 capsule (100 mg total) by mouth daily.   ibuprofen 600 MG tablet Commonly known as:  ADVIL,MOTRIN Take 1 tablet (600 mg total) by mouth every 6 (six) hours.   oxyCODONE-acetaminophen 5-325 MG tablet Commonly known as:  PERCOCET/ROXICET Take 1 tablet by mouth every 4 (four) hours as needed (pain scale 4-7).   prenatal multivitamin Tabs tablet Take 1 tablet by mouth daily at 12 noon.       Diet: routine diet  Activity: Advance as tolerated. Pelvic rest for 6 weeks.   Outpatient follow up:6 weeks Follow up Appt:No future appointments. Follow up Visit:No Follow-up on file.  Postpartum contraception: BTL  Newborn Data: Live born female  Birth Weight: 3 lb 2.8 oz (1440 g) APGAR: 9, 9  Newborn Delivery   Birth date/time:  03/07/2017 14:04:00 Delivery type:  C-Section, Low Transverse  C-section categorization:  Primary     Baby Feeding: Bottle Disposition:NICU   03/09/2017 Neta Mends, CNM

## 2017-03-09 NOTE — Plan of Care (Signed)
Problem: Activity: Goal: Will verbalize the importance of balancing activity with adequate rest periods Outcome: Adequate for Discharge Pt ambulating in room and hallway without difficulty.  Patient understands the need for adequate rest periods.   Problem: Education: Goal: Knowledge of condition will improve Outcome: Progressing Patient asking questions regarding postpartum c/s as this is her first c/s.  She is knowledgeable of her condition.   Problem: Coping: Goal: Ability to cope will improve Outcome: Progressing Patient demonstrating appropriate coping behaviors.  Baby in NICU- mom handling infant status well  Problem: Life Cycle: Goal: Risk for postpartum hemorrhage will decrease Outcome: Adequate for Discharge No signs of PP hemorrhage.  Uterus Firm and 2 below.  Scant lochia upon assessment.

## 2017-06-14 ENCOUNTER — Encounter: Payer: Self-pay | Admitting: Family Medicine

## 2017-06-14 ENCOUNTER — Encounter: Payer: Self-pay | Admitting: Sports Medicine

## 2017-06-14 ENCOUNTER — Ambulatory Visit: Payer: Self-pay

## 2017-06-14 ENCOUNTER — Ambulatory Visit (INDEPENDENT_AMBULATORY_CARE_PROVIDER_SITE_OTHER): Payer: BLUE CROSS/BLUE SHIELD | Admitting: Sports Medicine

## 2017-06-14 ENCOUNTER — Encounter: Payer: BLUE CROSS/BLUE SHIELD | Admitting: Family Medicine

## 2017-06-14 VITALS — BP 120/72 | HR 77 | Ht 63.0 in | Wt 160.0 lb

## 2017-06-14 DIAGNOSIS — M79672 Pain in left foot: Secondary | ICD-10-CM | POA: Diagnosis not present

## 2017-06-14 DIAGNOSIS — M79671 Pain in right foot: Secondary | ICD-10-CM

## 2017-06-14 DIAGNOSIS — G5762 Lesion of plantar nerve, left lower limb: Secondary | ICD-10-CM | POA: Diagnosis not present

## 2017-06-14 NOTE — Progress Notes (Signed)
Error

## 2017-06-14 NOTE — Progress Notes (Signed)
Mary Ingram. [facility] at [facility] [phone]  Mary Ingram - 42 y.o. female MRN [id number]  Date of birth: [birth year]  Visit Date: 06/14/2017  PCP: Helane Rima, DO   Referred by: No ref. provider found   Scribe for today's visit: Christoper Fabian, LAT, ATC     SUBJECTIVE:  Mary Ingram is here for New Patient (Initial Visit) (L foot pain) Referred by Dr. Earlene Plater.  Her L foot pain symptoms INITIALLY: Began a few years ago and is associated w/ running.  She is only able to run x 30 min when it gets unbearable. Described as 6-7/10 while running described as burning and tingling, radiating to L middle toes. Worsened with running or prolonged walking. Improved with rest Additional associated symptoms include: N/T into her L middle toes    At this time symptoms are worsening compared to onset w/ quicker onset of pain w/ activity. She has been using Superfeet orthotics (pink).   ROS Denies night time disturbances. Denies fevers, chills, or night sweats. Denies unexplained weight loss. Denies personal history of cancer. Denies changes in bowel or bladder habits. Denies recent unreported falls. Denies new or worsening dyspnea or wheezing. Denies headaches or dizziness.  Reports numbness, tingling or weakness  In the extremities, specifically her L toes. Denies dizziness or presyncopal episodes Denies lower extremity edema     HISTORY & PERTINENT PRIOR DATA:  Prior History reviewed and updated per electronic medical record.  Significant history, findings, studies and interim changes include:  reports that  has never smoked. she has never used smokeless tobacco. Recent Labs    03/06/17 1045  LABURIC 4.9   No specialty comments available. Problem  Morton's Neuroma of Left Foot    OBJECTIVE:  VS:  HT:5\' 3"  (160 cm)   WT:160 lb (72.6 kg)  BMI:28.35    BP:120/72  HR:77bpm  TEMP: ( )  RESP:    PHYSICAL  EXAM: Constitutional: WDWN, Non-toxic appearing. Psychiatric: Alert & appropriately interactive.  Not depressed or anxious appearing. Respiratory: No increased work of breathing.  Trachea Midline Eyes: Pupils are equal.  EOM intact without nystagmus.  No scleral icterus  NEUROVASCULAR exam: No clubbing or cyanosis appreciated No significant venous stasis changes Capillary Refill: normal, less than 2 seconds    LOWER Extremities  SWELLING Pre-tibial edema: No significant pretibial edema  PULSES Pedal Pulses: Normal & symmetrically palpable  SENSORY Dermatomes intact to light touch  MOTOR Normal strength in all myotomes  REFLEXES Reflexes: Normal & symmetric DTRs   Left foot has a small amount of pain with palpation of the metatarsal heads but this is minimal.  She has a markedly positive Mulder's click.  No significant pain with metatarsal squeeze test.  She has splay toe between first and second toes as well as early hammertoe of the second and third toes.  In curling of the fourth and fifth toes.  Brought her midfoot on the left compared to the right.  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  Early Morton's neuroma on the left greater than right.  No additional findings.   ASSESSMENT & PLAN:   1. Bilateral foot pain   2. Left foot pain   3. Morton's neuroma of left foot    PLAN: Morton's neuroma injected today under ultrasound guidance.  Transverse arch support recommended and we discussed that this is critical to ensuring this completely resolved 4.  Large metatarsal pad added to her sandals today and  recommended OTC SOL E orthotics for their improved transverse arch support.  Could consider custom orthotics if any lack of improvement.  Follow-up in 6 weeks to ensure clinical resolution.  Additional information regarding where she can obtain more pairs of a pad medium or large metatarsal pads provided.   ++++++++++++++++++++++++++++++++++++++++++++ Orders & Meds: Orders  Placed This Encounter  Procedures  . US GUIDED NEEDLE PLACEMENT(NO LINKED CHARGES)    No orders of the defined types were placed in this encounter.   ++++++++++++++++++++++++++++++++++++++++++++ Follow-up: Return in about 6 weeks (around 07/26/2017).   Pertinent documentation may be included in additional procedure notes, imaging studies, problem based documentation and patient instructions. Please see these sections of the encounter for additional information regarding this visit. CMA/ATC served as Neurosurgeonscribe during this visit. History, Physical, and Plan performed by medical provider. Documentation and orders reviewed and attested to.      Mary MewsMichael D Maxson Oddo, DO    [place] [facility] Physician

## 2017-06-14 NOTE — Procedures (Signed)
PROCEDURE NOTE:  Ultrasound Guided: Injection: Left Morton's neuroma, third webspace injection Images were obtained and interpreted by myself, Gaspar BiddingMichael Genesi Stefanko, DO  Images have been saved and stored to PACS system. Images obtained on: GE S7 Ultrasound machine  ULTRASOUND FINDINGS:  Small amount of swelling around the interdigital nerves.  DESCRIPTION OF PROCEDURE:  The patient's clinical condition is marked by substantial pain and/or significant functional disability. Other conservative therapy has not provided relief, is contraindicated, or not appropriate. There is a reasonable likelihood that injection will significantly improve the patient's pain and/or functional impairment.  After discussing the risks, benefits and expected outcomes of the injection and all questions were reviewed and answered, the patient wished to undergo the above named procedure. Verbal consent was obtained.  The ultrasound was used to identify the target structure and adjacent neurovascular structures. The skin was then prepped in sterile fashion and the target structure was injected under direct visualization using sterile technique as below:  PREP: Alcohol, Ethel Chloride,  APPROACH: direct, single injection, 25g 1.5in. INJECTATE: 0.5cc 0.5% marcaine, 0.5cc 80mg /mL DepoMedrol   ASPIRATE: None   DRESSING: Band-Aid   Post procedural instructions including recommending icing and warning signs for infection were reviewed.  This procedure was well tolerated and there were no complications.   IMPRESSION: Succesful Ultrasound Guided: Injection

## 2017-06-14 NOTE — Patient Instructions (Addendum)
I recommend that you obtain over-the-counter SOLE  medium cushioned insoles.  These can be found at [employer] - or on-line at [employer]  Search for "SOLE Active Medium Shoe Insoles"  Info regarding the metatarsal pads: Made by [employer] and you need either a size Med or Large  You had an injection today.  Things to be aware of after injection are listed below: . You may experience no significant improvement or even a slight worsening in your symptoms during the first 24 to 48 hours.  After that we expect your symptoms to improve gradually over the next 2 weeks for the medicine to have its maximal effect.  You should continue to have improvement out to 6 weeks after your injection. . Dr. Berline Choughigby recommends icing the site of the injection for 20 minutes  1-2 times the day of your injection . You may shower but no swimming, tub bath or Jacuzzi for 24 hours. . If your bandage falls off this does not need to be replaced.  It is appropriate to remove the bandage after 4 hours. . You may resume light activities as tolerated unless otherwise directed per Dr. Berline Choughigby during your visit  POSSIBLE STEROID SIDE EFFECTS:  Side effects from injectable steroids tend to be less than when taken orally however you may experience some of the symptoms listed below.  If experienced these should only last for a short period of time. Change in menstrual flow  Edema (swelling)  Increased appetite Skin flushing (redness)  Skin rash/acne  Thrush (oral) Yeast vaginitis    Increased sweating  Depression Increased blood glucose levels Cramping and leg/calf  Euphoria (feeling happy)  POSSIBLE PROCEDURE SIDE EFFECTS: The side effects of the injection are usually fairly minimal however if you may experience some of the following side effects that are usually self-limited and will is off on their own.  If you are concerned please feel free to call the office with questions:  Increased numbness or tingling  Nausea or  vomiting  Swelling or bruising at the injection site   Please call our office if if you experience any of the following symptoms over the next week as these can be signs of infection:   Fever greater than 100.57F  Significant swelling at the injection site  Significant redness or drainage from the injection site  If after 2 weeks you are continuing to have worsening symptoms please call our office to discuss what the next appropriate actions should be including the potential for a return office visit or other diagnostic testing.

## 2017-07-26 ENCOUNTER — Encounter: Payer: Self-pay | Admitting: Sports Medicine

## 2017-07-26 ENCOUNTER — Ambulatory Visit: Payer: BLUE CROSS/BLUE SHIELD | Admitting: Sports Medicine

## 2017-07-26 VITALS — BP 120/82 | HR 82 | Ht 63.0 in | Wt 162.6 lb

## 2017-07-26 DIAGNOSIS — M79672 Pain in left foot: Secondary | ICD-10-CM

## 2017-07-26 DIAGNOSIS — M25571 Pain in right ankle and joints of right foot: Secondary | ICD-10-CM | POA: Diagnosis not present

## 2017-07-26 DIAGNOSIS — G5762 Lesion of plantar nerve, left lower limb: Secondary | ICD-10-CM | POA: Diagnosis not present

## 2017-07-26 NOTE — Progress Notes (Signed)
Veverly FellsMichael D. Delorise Shinerigby, DO  [facility] [place] [phone]  Mary KittenChristina Hiser - 42 y.o. female MRN [id number]  Date of birth: 29-Apr-1976  Visit Date: 07/26/2017  PCP: Helane RimaWallace, Erica, DO   Referred by: Helane RimaWallace, Erica, DO   Scribe for today's visit: Stevenson ClinchBrandy Coleman, CMA     SUBJECTIVE:  Mary Ingram is here for Follow-up (bilateral foot pain)  06/14/17: Her L foot pain symptoms INITIALLY: Began a few years ago and is associated w/ running.  She is only able to run x 30 min when it gets unbearable. Described as 6-7/10 while running described as burning and tingling, radiating to L middle toes. Worsened with running or prolonged walking. Improved with rest Additional associated symptoms include: N/T into her L middle toes At this time symptoms are worsening compared to onset w/ quicker onset of pain w/ activity. She has been using Superfeet orthotics (pink).  07/26/17: Compared to the last office visit, her previously described symptoms are improving  Current symptoms are mild & are nonradiating. She still feels some warmth when running but no pain.  She has been wearing superfeet orthotics. She received steroid injection 06/14/17.   She was running hills Sunday and then noticed bruising on the medial aspect of the R ankle. She swam for 2 days and then tried to run on the treadmill and noticed that the bruising got worse. She didn't really feel any pain and there is no pain while walking.    ROS Denies night time disturbances. Denies fevers, chills, or night sweats. Denies unexplained weight loss. Denies personal history of cancer. Denies changes in bowel or bladder habits. Denies recent unreported falls. Denies new or worsening dyspnea or wheezing. Denies headaches or dizziness.  Denies numbness, tingling or weakness  In the extremities.  Denies dizziness or presyncopal episodes Denies lower extremity edema    HISTORY & PERTINENT PRIOR  DATA:  Prior History reviewed and updated per electronic medical record.  Significant history, findings, studies and interim changes include:  reports that  has never smoked. she has never used smokeless tobacco. Recent Labs    03/06/17 1045  LABURIC 4.9   No specialty comments available. No problems updated.  OBJECTIVE:  VS:  HT:5\' 3"  (160 cm)   WT:162 lb 9.6 oz (73.8 kg)  BMI:28.81    BP:120/82  HR:82bpm  TEMP: ( )  RESP:97 %   PHYSICAL EXAM: Constitutional: WDWN, Non-toxic appearing. Psychiatric: Alert & appropriately interactive.  Not depressed or anxious appearing. Respiratory: No increased work of breathing.  Trachea Midline Eyes: Pupils are equal.  EOM intact without nystagmus.  No scleral icterus  NEUROVASCULAR exam: No clubbing or cyanosis appreciated No significant venous stasis changes Capillary Refill: normal, less than 2 seconds   Right ankle: Overall well aligned she does have bruising on the medial aspect that is focally tender over the medial cortex of the distal tibia but no pain directly over the medial malleolus.  She has no focal pain with testing of the posterior tibialis or the anterior tibialis tendons.  She has no pain with palpation over the anterior medial ankle joint.  The actual anterior ankle joint is pain-free.  Inversion, eversion, plantar flexion and dorsi flexion strength is normal.  Ankle drawer testing is stable.  Left foot is well aligned with markedly high cavus foot bilaterally.  She has improved pain with palpation of the left webspace.   ASSESSMENT & PLAN:   1. Left foot pain   2.  Morton's neuroma of left foot   3. Arthralgia of right ankle    ++++++++++++++++++++++++++++++++++++++++++++ Orders & Meds: No orders of the defined types were placed in this encounter.  No orders of the defined types were placed in this encounter.   ++++++++++++++++++++++++++++++++++++++++++++ PLAN:   Findings:  Overall she is doing significantly  better.  Her intermetatarsal pain has resolved with SOL E orthotics.  She is having improved comfort with metatarsal pads previously provided.  She reports having some sensation of not being fully supported within the longitudinal arch and struts information was provided today.  Right ankle is bruised but tender directly over the area of her distal tibia and I believe this is actually reflective more of a contusion likely from running from striking her medial tibia.  She does not recall any specific injury but has no pain with running or walking but is sore after activity such as running around a gym and track.  If any lack of improvement or worsening symptoms over the next 2 weeks she will call us to get back in here for further evaluation but at this time rest, compression and icing recommended.   No problem-specific Assessment & Plan notes found for this encounter.   Follow-up: Return if symptoms worsen or fail to improve.   Pertinent documentation may be included in additional procedure notes, imaging studies, problem based documentation and patient instructions. Please see these sections of the encounter for additional information regarding this visit. CMA/ATC served as Neurosurgeon during this visit. History, Physical, and Plan performed by medical provider. Documentation and orders reviewed and attested to.      Andrena Mews, DO    [place] [facility] Physician

## 2017-07-26 NOTE — Patient Instructions (Signed)
Please see separate sheet w/ info on Strutz inserts for shoes

## 2018-02-11 ENCOUNTER — Telehealth: Payer: Self-pay | Admitting: *Deleted

## 2018-02-11 NOTE — Telephone Encounter (Signed)
Copied from CRM [id number]. Topic: General - Other >> Feb 11, 2018  9:46 AM Baldo DaubAlexander, Amber L wrote: Reason for CRM:   Pt requesting to speak with physician.  Pt would not leave a message or let me know what call concerned.  Pt can be reached at [phone]

## 2018-02-11 NOTE — Telephone Encounter (Signed)
Mary MuirJamie spoke to patient and made all appointments needed.

## 2018-03-05 DIAGNOSIS — Z1329 Encounter for screening for other suspected endocrine disorder: Secondary | ICD-10-CM | POA: Diagnosis not present

## 2018-03-05 DIAGNOSIS — Z1231 Encounter for screening mammogram for malignant neoplasm of breast: Secondary | ICD-10-CM | POA: Diagnosis not present

## 2018-03-05 DIAGNOSIS — Z0000 Encounter for general adult medical examination without abnormal findings: Secondary | ICD-10-CM | POA: Diagnosis not present

## 2018-03-05 DIAGNOSIS — Z01419 Encounter for gynecological examination (general) (routine) without abnormal findings: Secondary | ICD-10-CM | POA: Diagnosis not present

## 2018-03-05 DIAGNOSIS — Z131 Encounter for screening for diabetes mellitus: Secondary | ICD-10-CM | POA: Diagnosis not present

## 2018-03-05 DIAGNOSIS — Z6824 Body mass index (BMI) 24.0-24.9, adult: Secondary | ICD-10-CM | POA: Diagnosis not present

## 2018-03-05 DIAGNOSIS — Z13220 Encounter for screening for lipoid disorders: Secondary | ICD-10-CM | POA: Diagnosis not present

## 2018-03-05 DIAGNOSIS — Z23 Encounter for immunization: Secondary | ICD-10-CM | POA: Diagnosis not present

## 2018-06-30 ENCOUNTER — Encounter: Payer: Self-pay | Admitting: Family Medicine

## 2018-06-30 ENCOUNTER — Telehealth: Payer: Self-pay | Admitting: Family Medicine

## 2018-06-30 ENCOUNTER — Ambulatory Visit: Payer: BLUE CROSS/BLUE SHIELD | Admitting: Family Medicine

## 2018-06-30 VITALS — BP 110/72 | HR 72 | Temp 97.5°F | Ht 63.0 in | Wt 159.5 lb

## 2018-06-30 DIAGNOSIS — H109 Unspecified conjunctivitis: Secondary | ICD-10-CM

## 2018-06-30 MED ORDER — OLOPATADINE HCL 0.2 % OP SOLN: OPHTHALMIC | 0 refills | Status: AC

## 2018-06-30 MED ORDER — POLYMYXIN B-TRIMETHOPRIM 10000-0.1 UNIT/ML-% OP SOLN: 2.0000 [drp] | OPHTHALMIC | 0 refills | Status: AC

## 2018-06-30 NOTE — Patient Instructions (Signed)
It was very nice to see you today!  Please continue using the Polytrim drops.  Please start the Pataday drops as well.  You can use cool compresses and ice packs to the area.  Please let us know if your symptoms worsen or not improve in the next few days.  Take care, Dr Jimmey Ralph

## 2018-06-30 NOTE — Progress Notes (Signed)
   Chief Complaint:  Mary Ingram is a 43 y.o. female who presents for same day appointment with a chief complaint of red eye.   Assessment/Plan:  Conjunctivitis Likely viral given that several of her children have been sick with similar symptoms.  She will continue using her Polytrim drops to prevent bacterial superinfection.  Start Pataday drops to help with her symptoms.  Continue cool compresses to the area with symptoms.  Discussed reasons to return to care and seek emergent care.  Follow-up as needed.     Subjective:  HPI:  Red Eye, acute problem Started about a day ago. Located in both eyes. Children with similar symptoms.  Eyes are irritated but are not painful.  She has had some watery discharge from the area as well.  She has tried using Polytrim drops for the last day with no significant improvement.  Seems like her symptoms are getting worse.  No fevers or chills.  No other treatments tried.  No other obvious alleviating or aggravating factors.    ROS: Per HPI  PMH: She reports that she has never smoked. She has never used smokeless tobacco. She reports that she does not drink alcohol or use drugs.      Objective:  Physical Exam: BP 110/72 (BP Location: Left Arm, Patient Position: Sitting, Cuff Size: Normal)   Pulse 72   Temp (!) 97.5 F (36.4 C) (Oral)   Ht 5\' 3"  (1.6 m)   Wt 159 lb 8 oz (72.3 kg)   SpO2 96%   BMI 28.25 kg/m   Gen: NAD, resting comfortably HEENT: Bilateral conjunctival erythema noted.  Extraocular eye movements intact without pain.  Small amount of watery discharge noted bilaterally. CV: Regular rate and rhythm with no murmurs appreciated Pulm: Normal work of breathing, clear to auscultation bilaterally with no crackles, wheezes, or rhonchi      Estuardo Frisbee M. Jimmey Ralph, MD 06/30/2018 1:05 PM

## 2018-06-30 NOTE — Telephone Encounter (Signed)
See note, patient is seeing Dr. Jimmey Ralph today at 1pm  Copied from CRM [id number]. Topic: Appointment Scheduling - Scheduling Inquiry for Clinic >> Jun 30, 2018  7:42 AM Leafy Ro wrote: Reason for CRM: pt has swollen eyes started yesterday. Pt is able to see. Pt eyes are red with drainage

## 2018-06-30 NOTE — Telephone Encounter (Signed)
Noted  

## 2018-10-06 ENCOUNTER — Ambulatory Visit: Payer: BLUE CROSS/BLUE SHIELD | Admitting: Family Medicine

## 2018-10-06 ENCOUNTER — Other Ambulatory Visit: Payer: Self-pay

## 2018-10-06 ENCOUNTER — Encounter: Payer: Self-pay | Admitting: Family Medicine

## 2018-10-06 ENCOUNTER — Ambulatory Visit (INDEPENDENT_AMBULATORY_CARE_PROVIDER_SITE_OTHER): Payer: BLUE CROSS/BLUE SHIELD

## 2018-10-06 VITALS — BP 130/88 | HR 79 | Ht 63.0 in

## 2018-10-06 DIAGNOSIS — M79672 Pain in left foot: Secondary | ICD-10-CM

## 2018-10-06 MED ORDER — DICLOFENAC SODIUM 2 % TD SOLN: 1.0000 "application " | Freq: Two times a day (BID) | TRANSDERMAL | 3 refills | Status: DC

## 2018-10-06 NOTE — Progress Notes (Signed)
Mary Ingram - 43 y.o. female MRN [id number]  Date of birth: 1976/02/03  SUBJECTIVE:  Including CC & ROS.  Chief Complaint  Patient presents with  . Pain    left foot neuroma/ wants injection     Mary Ingram is a 43 y.o. female that is presenting with left foot pain.  She has a history of Morton's neuroma.  She is having greater pain on the dorsal aspect of the second, third, and fourth MTP joints.  She only feels the pain after she runs 3 miles.  The pain also radiates over the lateral aspect of the forefoot and midfoot.  She denies any injury.  Has increased her mileage during the coronavirus pandemic.  She normally swims but is unable to get access to a pool.  She does bike without pain.  She denies any swelling or ecchymosis.  Pain is only on the left foot.  Has not done anything for the pain.  Pain is sharp and stabbing.   Review of Systems  Constitutional: Negative for fever.  HENT: Negative for congestion.   Respiratory: Negative for cough.   Cardiovascular: Negative for chest pain.  Gastrointestinal: Negative for abdominal pain.  Musculoskeletal: Positive for gait problem.  Skin: Negative for color change.  Neurological: Negative for weakness.  Hematological: Negative for adenopathy.    HISTORY: Past Medical, Surgical, Social, and Family History Reviewed & Updated per EMR.   Pertinent Historical Findings include:  Past Medical History:  Diagnosis Date  . Tuberculosis    Positive skin test in college, took medication    Past Surgical History:  Procedure Laterality Date  . CESAREAN SECTION N/A 03/07/2017   Procedure: CESAREAN SECTION;  Surgeon: Vick Frees, MD;  Location: [facility] BIRTHING SUITES;  Service: Obstetrics;  Laterality: N/A;    No Known Allergies  Family History  Problem Relation Age of Onset  . Diabetes Mother   . Hypertension Mother   . Hyperlipidemia Mother   . Diabetes Maternal Grandmother   . Hyperlipidemia Maternal Grandmother   .  Hypertension Maternal Grandmother   . Mental illness Maternal Grandmother   . Diabetes Maternal Grandfather   . Heart disease Maternal Grandfather   . Hyperlipidemia Maternal Grandfather   . Hypertension Maternal Grandfather   . Mental illness Maternal Grandfather   . Alzheimer's disease Paternal Grandmother      Social History   Socioeconomic History  . Marital status: Married    Spouse name: Not on file  . Number of children: Not on file  . Years of education: Not on file  . Highest education level: Not on file  Occupational History  . Not on file  Social Needs  . Financial resource strain: Not on file  . Food insecurity:    Worry: Not on file    Inability: Not on file  . Transportation needs:    Medical: Not on file    Non-medical: Not on file  Tobacco Use  . Smoking status: Never Smoker  . Smokeless tobacco: Never Used  Substance and Sexual Activity  . Alcohol use: No  . Drug use: No  . Sexual activity: Yes  Lifestyle  . Physical activity:    Days per week: Not on file    Minutes per session: Not on file  . Stress: Not on file  Relationships  . Social connections:    Talks on phone: Not on file    Gets together: Not on file    Attends religious service: Not on file  Active member of club or organization: Not on file    Attends meetings of clubs or organizations: Not on file    Relationship status: Not on file  . Intimate partner violence:    Fear of current or ex partner: Not on file    Emotionally abused: Not on file    Physically abused: Not on file    Forced sexual activity: Not on file  Other Topics Concern  . Not on file  Social History Narrative  . 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 Tenderness to palpation of the second and fourth MCP joints. Mild hallux valgus. No leg length discrepancy. Fairly good stability with one leg testing. Normal rise up on the tiptoes. Weakness with hip abduction. Neurovascular intact  Limited ultrasound: Left foot:  Effusion appreciated in the second and first MTP joints.  This was also occurring in the right foot.  There is no increased vascular uptake in these joints. No neuroma was appreciated between the second and third interdigital space of the third and fourth interdigital space. Normal-appearing metatarsals.  Summary: No findings specific for the source of her pain.  Ultrasound and interpretation by Clare GandyJeremy Clois Montavon, MD      ASSESSMENT & PLAN:   Left foot pain Has significant weakness with hip abduction and this may be the source of the pain as to why this happens during the course of around.  No neuroma appreciated with ultrasound scan today.  Possible is more metatarsalgia as a source of her pain. -Provided Duexis samples and Pennsaid sample.  Sent Pennsaid in. -Counseled on home exercise therapy and supportive care. -X-ray. -If no improvement can consider an injection.

## 2018-10-06 NOTE — Assessment & Plan Note (Signed)
Has significant weakness with hip abduction and this may be the source of the pain as to why this happens during the course of around.  No neuroma appreciated with ultrasound scan today.  Possible is more metatarsalgia as a source of her pain. -Provided Duexis samples and Pennsaid sample.  Sent Pennsaid in. -Counseled on home exercise therapy and supportive care. -X-ray. -If no improvement can consider an injection.

## 2018-10-06 NOTE — Patient Instructions (Addendum)
Nice to meet you  Please try decreasing your mileage over the next two weeks. Then you can increase 10% of your total per week.  Please try the duexis then transition to the pennsaid  Please try ice on the area  Please try the exercises  Please see me back in 4-6 weeks.  Please send me a message in MyChart with any questions or updates.

## 2018-10-07 ENCOUNTER — Telehealth: Payer: Self-pay | Admitting: Family Medicine

## 2018-10-07 NOTE — Telephone Encounter (Signed)
Left VM for patient. If she calls back please have her speak with a nurse/CMA and inform that her xray is normal. The [facility] can report results to patient.   If any questions then please take the best time and phone number to call and I will try to call her back.   Myra Rude, MD [facility] 10/07/2018, 10:12 AM

## 2018-10-07 NOTE — Telephone Encounter (Signed)
Pt informed of results. Pt verbalized understanding. Pt had no further questions.

## 2018-10-10 ENCOUNTER — Ambulatory Visit: Payer: BLUE CROSS/BLUE SHIELD | Admitting: Family Medicine

## 2018-10-27 ENCOUNTER — Other Ambulatory Visit: Payer: Self-pay

## 2018-10-27 DIAGNOSIS — Z20822 Contact with and (suspected) exposure to covid-19: Secondary | ICD-10-CM

## 2018-10-27 DIAGNOSIS — Z20828 Contact with and (suspected) exposure to other viral communicable diseases: Secondary | ICD-10-CM

## 2018-10-28 ENCOUNTER — Other Ambulatory Visit (INDEPENDENT_AMBULATORY_CARE_PROVIDER_SITE_OTHER): Payer: BC Managed Care – PPO

## 2018-10-28 ENCOUNTER — Other Ambulatory Visit: Payer: Self-pay

## 2018-10-28 DIAGNOSIS — Z20828 Contact with and (suspected) exposure to other viral communicable diseases: Secondary | ICD-10-CM

## 2018-10-29 LAB — SAR COV2 SEROLOGY (COVID19)AB(IGG),IA: SARS CoV2 AB IGG: NEGATIVE

## 2019-01-29 ENCOUNTER — Other Ambulatory Visit: Payer: Self-pay | Admitting: Family Medicine

## 2019-02-18 ENCOUNTER — Encounter: Payer: Self-pay | Admitting: Family Medicine

## 2019-02-18 ENCOUNTER — Ambulatory Visit (INDEPENDENT_AMBULATORY_CARE_PROVIDER_SITE_OTHER): Payer: BC Managed Care – PPO | Admitting: Family Medicine

## 2019-02-18 DIAGNOSIS — Z23 Encounter for immunization: Secondary | ICD-10-CM

## 2019-02-18 NOTE — Progress Notes (Signed)
Flu shot given today

## 2019-02-19 ENCOUNTER — Telehealth: Payer: Self-pay | Admitting: Family Medicine

## 2019-02-19 NOTE — Telephone Encounter (Signed)
Patient is calling to ensure that the insurance will be billed for the flu shoot. Please advise.

## 2019-02-19 NOTE — Telephone Encounter (Signed)
I contacted the patient to advise that he insurance was assigned to her flu shot visit to be billed. I notified that billing takes care of everything going forward with billing. No need to route note, for documentation purposes only.

## 2019-02-23 ENCOUNTER — Ambulatory Visit: Payer: BC Managed Care – PPO | Admitting: Family Medicine

## 2019-02-25 ENCOUNTER — Ambulatory Visit: Payer: BC Managed Care – PPO | Admitting: Family Medicine

## 2019-02-27 ENCOUNTER — Ambulatory Visit: Payer: BC Managed Care – PPO

## 2019-03-13 ENCOUNTER — Encounter: Payer: Self-pay | Admitting: Family Medicine

## 2019-03-13 DIAGNOSIS — Z01419 Encounter for gynecological examination (general) (routine) without abnormal findings: Secondary | ICD-10-CM | POA: Diagnosis not present

## 2019-03-13 DIAGNOSIS — Z6826 Body mass index (BMI) 26.0-26.9, adult: Secondary | ICD-10-CM | POA: Diagnosis not present

## 2019-03-13 DIAGNOSIS — Z1231 Encounter for screening mammogram for malignant neoplasm of breast: Secondary | ICD-10-CM | POA: Diagnosis not present

## 2019-08-10 ENCOUNTER — Other Ambulatory Visit: Payer: Self-pay | Admitting: Family Medicine

## 2019-09-16 IMAGING — DX LEFT FOOT - COMPLETE 3+ VIEW
3 series · 3 of 3 positions shown · non-contrast
Comparison: None.

CLINICAL DATA: 43-year-old female with a history of chronic pain
from running.

EXAM:
LEFT FOOT - COMPLETE 3+ VIEW

[foot ap]
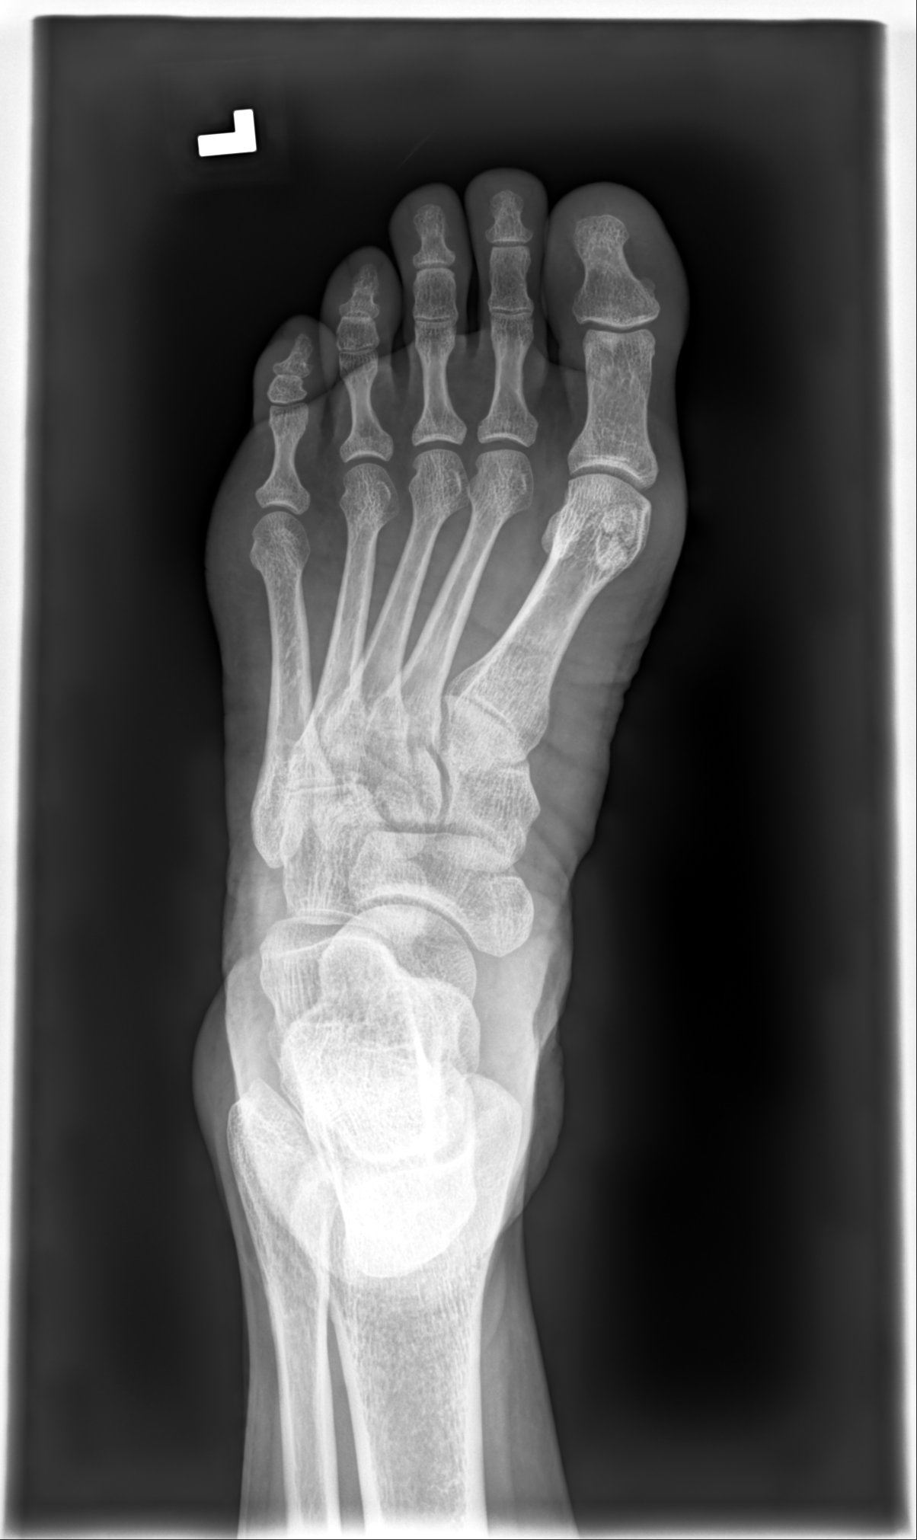

[foot mlo]
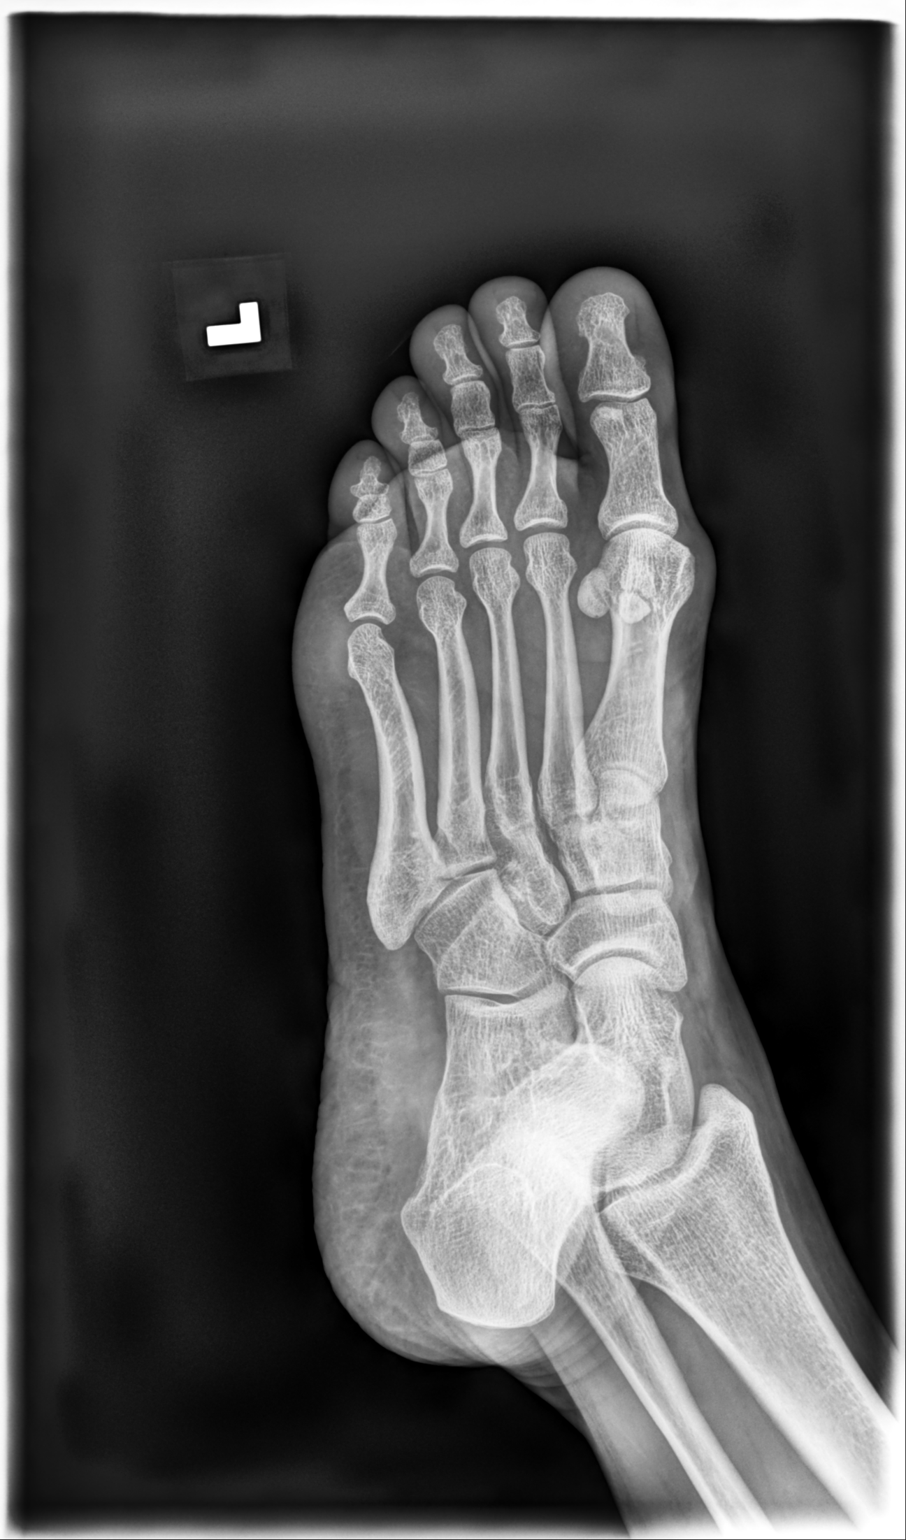

[foot lat]
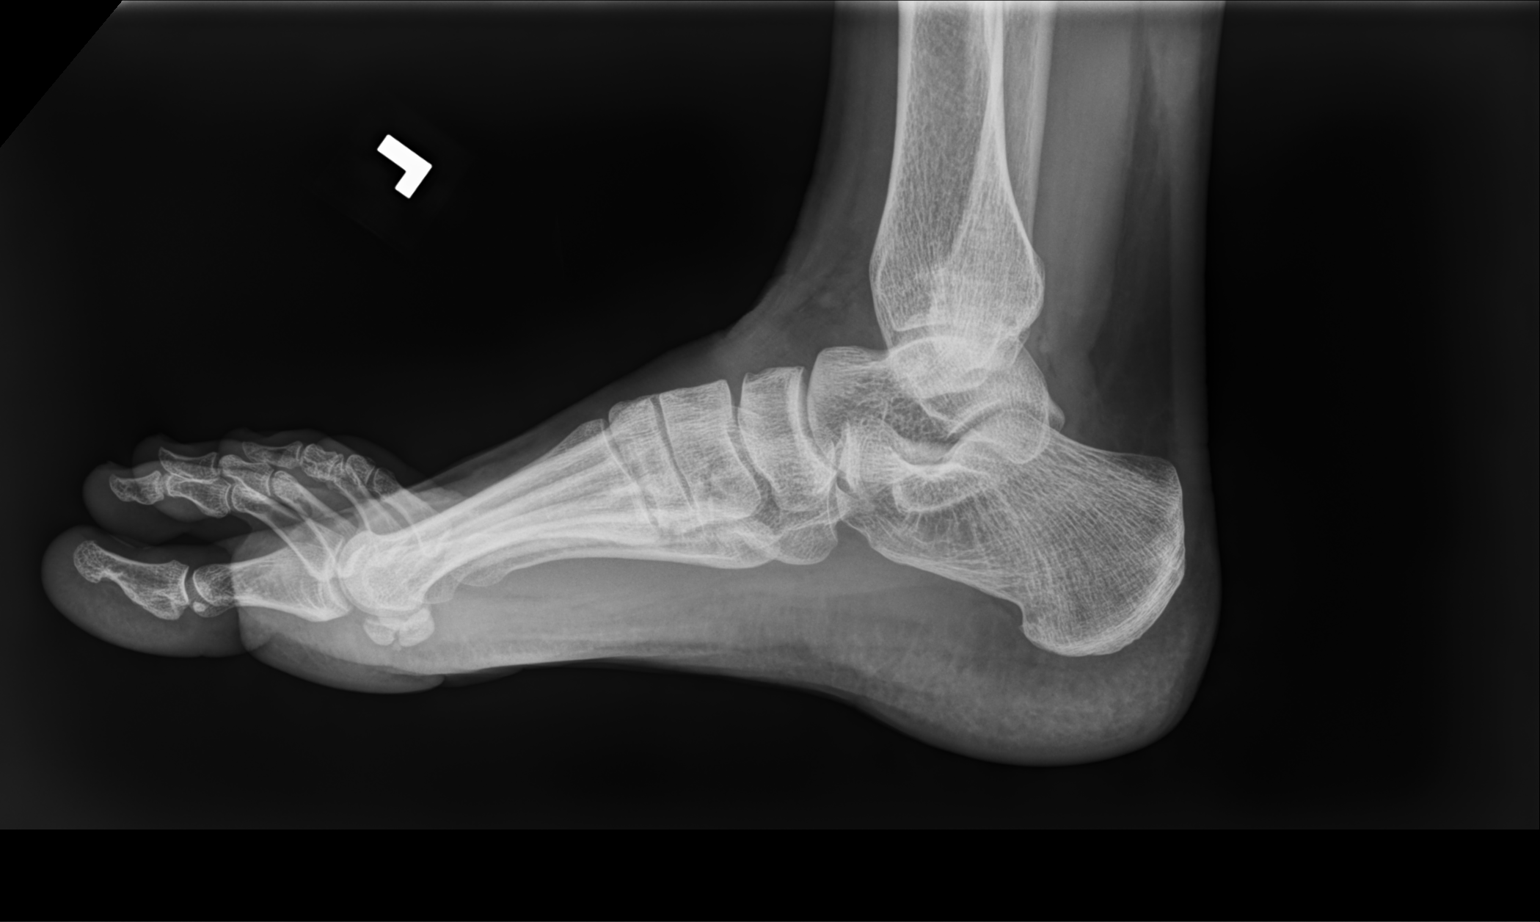

[3 of 3 positions shown; findings below may reference images not displayed]

FINDINGS: No acute displaced fracture. No cortical thickening or periosteal
reaction. No radiopaque foreign body. No subluxation/dislocation. No
focal soft tissue swelling. No significant degenerative changes.
IMPRESSION: Negative for acute bony abnormality

## 2022-04-15 LAB — COLOGUARD: COLOGUARD: NEGATIVE

## 2024-07-07 ENCOUNTER — Ambulatory Visit: Admitting: Family Medicine
